# Patient Record
Sex: Female | Born: 1980 | Race: White | Hispanic: No | Marital: Single | Smoking: Current every day smoker
Health system: Southern US, Community
[De-identification: ages and names within clinical notes are randomized; demographics above are authoritative.]

---
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
## 2001-01-30 ENCOUNTER — Encounter: Payer: Self-pay | Admitting: Emergency Medicine

## 2001-01-30 ENCOUNTER — Emergency Department (HOSPITAL_COMMUNITY): Admission: EM | Admit: 2001-01-30 | Discharge: 2001-01-30 | Payer: Self-pay | Admitting: Emergency Medicine

## 2008-04-18 ENCOUNTER — Inpatient Hospital Stay (HOSPITAL_COMMUNITY): Admission: AD | Admit: 2008-04-18 | Discharge: 2008-04-18 | Payer: Self-pay | Admitting: Obstetrics & Gynecology

## 2008-10-03 ENCOUNTER — Ambulatory Visit: Payer: Self-pay | Admitting: Obstetrics & Gynecology

## 2008-10-04 ENCOUNTER — Inpatient Hospital Stay: Payer: Self-pay | Admitting: Obstetrics & Gynecology

## 2010-11-07 ENCOUNTER — Emergency Department: Payer: Self-pay | Admitting: Emergency Medicine

## 2011-02-06 LAB — URINALYSIS, ROUTINE W REFLEX MICROSCOPIC
Bilirubin Urine: NEGATIVE
Glucose, UA: NEGATIVE mg/dL
Hgb urine dipstick: NEGATIVE
Ketones, ur: NEGATIVE mg/dL
Nitrite: NEGATIVE
Protein, ur: NEGATIVE mg/dL
Specific Gravity, Urine: 1.015 (ref 1.005–1.030)
Urobilinogen, UA: 0.2 mg/dL (ref 0.0–1.0)
pH: 8.5 — ABNORMAL HIGH (ref 5.0–8.0)

## 2011-02-06 LAB — WET PREP, GENITAL
Clue Cells Wet Prep HPF POC: NONE SEEN
Trich, Wet Prep: NONE SEEN
Yeast Wet Prep HPF POC: NONE SEEN

## 2011-02-06 LAB — GC/CHLAMYDIA PROBE AMP, GENITAL
Chlamydia, DNA Probe: NEGATIVE
GC Probe Amp, Genital: NEGATIVE

## 2011-10-14 ENCOUNTER — Emergency Department: Payer: Self-pay | Admitting: Emergency Medicine

## 2011-11-10 ENCOUNTER — Emergency Department: Payer: Self-pay | Admitting: Emergency Medicine

## 2011-11-10 LAB — CBC WITH DIFFERENTIAL/PLATELET
Basophil #: 0 10*3/uL (ref 0.0–0.1)
Basophil %: 0.7 %
Eosinophil #: 0.2 10*3/uL (ref 0.0–0.7)
Eosinophil %: 3.6 %
HCT: 37.8 % (ref 35.0–47.0)
HGB: 12.3 g/dL (ref 12.0–16.0)
Lymphocyte #: 1.8 10*3/uL (ref 1.0–3.6)
Lymphocyte %: 26.4 %
MCH: 29.4 pg (ref 26.0–34.0)
MCHC: 32.5 g/dL (ref 32.0–36.0)
MCV: 90 fL (ref 80–100)
Monocyte #: 0.5 x10 3/mm (ref 0.2–0.9)
Monocyte %: 8.1 %
Neutrophil #: 4.1 10*3/uL (ref 1.4–6.5)
Neutrophil %: 61.2 %
Platelet: 273 10*3/uL (ref 150–440)
RBC: 4.19 10*6/uL (ref 3.80–5.20)
RDW: 14.6 % — ABNORMAL HIGH (ref 11.5–14.5)
WBC: 6.7 10*3/uL (ref 3.6–11.0)

## 2011-11-10 LAB — URINALYSIS, COMPLETE
Bacteria: NONE SEEN
Bilirubin,UR: NEGATIVE
Blood: NEGATIVE
Glucose,UR: NEGATIVE mg/dL (ref 0–75)
Ketone: NEGATIVE
Nitrite: NEGATIVE
Ph: 5 (ref 4.5–8.0)
Protein: NEGATIVE
RBC,UR: 1 /HPF (ref 0–5)
Specific Gravity: 1.019 (ref 1.003–1.030)
Squamous Epithelial: 24
Transitional Epi: <1
WBC UR: 7 /HPF (ref 0–5)

## 2011-11-10 LAB — COMPREHENSIVE METABOLIC PANEL
Albumin: 3.4 g/dL (ref 3.4–5.0)
Alkaline Phosphatase: 84 U/L (ref 50–136)
Anion Gap: 5 — ABNORMAL LOW (ref 7–16)
BUN: 13 mg/dL (ref 7–18)
Bilirubin,Total: 0.3 mg/dL (ref 0.2–1.0)
Calcium, Total: 8.7 mg/dL (ref 8.5–10.1)
Chloride: 107 mmol/L (ref 98–107)
Co2: 30 mmol/L (ref 21–32)
Creatinine: 0.91 mg/dL (ref 0.60–1.30)
EGFR (African American): >60
EGFR (Non-African Amer.): >60
Glucose: 88 mg/dL (ref 65–99)
Osmolality: 283 (ref 275–301)
Potassium: 4.7 mmol/L (ref 3.5–5.1)
SGOT(AST): 26 U/L (ref 15–37)
SGPT (ALT): 14 U/L
Sodium: 142 mmol/L (ref 136–145)
Total Protein: 7.4 g/dL (ref 6.4–8.2)

## 2011-11-10 LAB — SEDIMENTATION RATE: Erythrocyte Sed Rate: 1 mm/hr (ref 0–20)

## 2011-11-10 LAB — URIC ACID: Uric Acid: 3.9 mg/dL (ref 2.6–6.0)

## 2015-06-10 ENCOUNTER — Encounter: Payer: Self-pay | Admitting: Emergency Medicine

## 2015-06-10 ENCOUNTER — Emergency Department: Payer: No Typology Code available for payment source

## 2015-06-10 ENCOUNTER — Emergency Department
Admission: EM | Admit: 2015-06-10 | Discharge: 2015-06-10 | Disposition: A | Discharge status: Home / Self Care | Payer: No Typology Code available for payment source | Attending: Emergency Medicine | Admitting: Emergency Medicine

## 2015-06-10 DIAGNOSIS — Y92410 Unspecified street and highway as the place of occurrence of the external cause: Secondary | ICD-10-CM | POA: Diagnosis not present

## 2015-06-10 DIAGNOSIS — S1081XA Abrasion of other specified part of neck, initial encounter: Secondary | ICD-10-CM | POA: Diagnosis not present

## 2015-06-10 DIAGNOSIS — F17210 Nicotine dependence, cigarettes, uncomplicated: Secondary | ICD-10-CM | POA: Diagnosis not present

## 2015-06-10 DIAGNOSIS — M62838 Other muscle spasm: Secondary | ICD-10-CM | POA: Diagnosis not present

## 2015-06-10 DIAGNOSIS — S199XXA Unspecified injury of neck, initial encounter: Secondary | ICD-10-CM | POA: Diagnosis present

## 2015-06-10 DIAGNOSIS — G8929 Other chronic pain: Secondary | ICD-10-CM | POA: Diagnosis not present

## 2015-06-10 DIAGNOSIS — S3992XA Unspecified injury of lower back, initial encounter: Secondary | ICD-10-CM | POA: Diagnosis not present

## 2015-06-10 DIAGNOSIS — S161XXA Strain of muscle, fascia and tendon at neck level, initial encounter: Secondary | ICD-10-CM

## 2015-06-10 DIAGNOSIS — S20312A Abrasion of left front wall of thorax, initial encounter: Secondary | ICD-10-CM | POA: Insufficient documentation

## 2015-06-10 DIAGNOSIS — Y9389 Activity, other specified: Secondary | ICD-10-CM | POA: Insufficient documentation

## 2015-06-10 DIAGNOSIS — Y998 Other external cause status: Secondary | ICD-10-CM | POA: Insufficient documentation

## 2015-06-10 MED ORDER — IBUPROFEN 800 MG PO TABS: 800.0000 mg | ORAL_TABLET | Freq: Three times a day (TID) | ORAL | Status: DC | PRN

## 2015-06-10 MED ORDER — CYCLOBENZAPRINE HCL 10 MG PO TABS: 10.0000 mg | ORAL_TABLET | Freq: Three times a day (TID) | ORAL | Status: DC | PRN

## 2015-06-10 MED ORDER — IBUPROFEN 800 MG PO TABS
800.0000 mg | ORAL_TABLET | Freq: Once | ORAL | Status: AC
  Administered 2015-06-10: 800 mg via ORAL
  Filled 2015-06-10: qty 1

## 2015-06-10 NOTE — Discharge Instructions (Signed)
Cervical Sprain A cervical sprain is an injury in the neck in which the strong, fibrous tissues (ligaments) that connect your neck bones stretch or tear. Cervical sprains can range from mild to severe. Severe cervical sprains can cause the neck vertebrae to be unstable. This can lead to damage of the spinal cord and can result in serious nervous system problems. The amount of time it takes for a cervical sprain to get better depends on the cause and extent of the injury. Most cervical sprains heal in 1 to 3 weeks. CAUSES  Severe cervical sprains may be caused by:   Contact sport injuries (such as from football, rugby, wrestling, hockey, auto racing, gymnastics, diving, martial arts, or boxing).   Motor vehicle collisions.   Whiplash injuries. This is an injury from a sudden forward and backward whipping movement of the head and neck.  Falls.  Mild cervical sprains may be caused by:   Being in an awkward position, such as while cradling a telephone between your ear and shoulder.   Sitting in a chair that does not offer proper support.   Working at a poorly Landscape architect station.   Looking up or down for long periods of time.  SYMPTOMS   Pain, soreness, stiffness, or a burning sensation in the front, back, or sides of the neck. This discomfort may develop immediately after the injury or slowly, 24 hours or more after the injury.   Pain or tenderness directly in the middle of the back of the neck.   Shoulder or upper back pain.   Limited ability to move the neck.   Headache.   Dizziness.   Weakness, numbness, or tingling in the hands or arms.   Muscle spasms.   Difficulty swallowing or chewing.   Tenderness and swelling of the neck.  DIAGNOSIS  Most of the time your health care provider can diagnose a cervical sprain by taking your history and doing a physical exam. Your health care provider will ask about previous neck injuries and any known neck  problems, such as arthritis in the neck. X-rays may be taken to find out if there are any other problems, such as with the bones of the neck. Other tests, such as a CT scan or MRI, may also be needed.  TREATMENT  Treatment depends on the severity of the cervical sprain. Mild sprains can be treated with rest, keeping the neck in place (immobilization), and pain medicines. Severe cervical sprains are immediately immobilized. Further treatment is done to help with pain, muscle spasms, and other symptoms and may include:  Medicines, such as pain relievers, numbing medicines, or muscle relaxants.   Physical therapy. This may involve stretching exercises, strengthening exercises, and posture training. Exercises and improved posture can help stabilize the neck, strengthen muscles, and help stop symptoms from returning.  HOME CARE INSTRUCTIONS   Put ice on the injured area.   Put ice in a plastic bag.   Place a towel between your skin and the bag.   Leave the ice on for 15-20 minutes, 3-4 times a day.   If your injury was severe, you may have been given a cervical collar to wear. A cervical collar is a two-piece collar designed to keep your neck from moving while it heals.  Do not remove the collar unless instructed by your health care provider.  If you have long hair, keep it outside of the collar.  Ask your health care provider before making any adjustments to your collar. Minor  adjustments may be required over time to improve comfort and reduce pressure on your chin or on the back of your head.  Ifyou are allowed to remove the collar for cleaning or bathing, follow your health care provider's instructions on how to do so safely.  Keep your collar clean by wiping it with mild soap and water and drying it completely. If the collar you have been given includes removable pads, remove them every 1-2 days and hand wash them with soap and water. Allow them to air dry. They should be completely  dry before you wear them in the collar.  If you are allowed to remove the collar for cleaning and bathing, wash and dry the skin of your neck. Check your skin for irritation or sores. If you see any, tell your health care provider.  Do not drive while wearing the collar.   Only take over-the-counter or prescription medicines for pain, discomfort, or fever as directed by your health care provider.   Keep all follow-up appointments as directed by your health care provider.   Keep all physical therapy appointments as directed by your health care provider.   Make any needed adjustments to your workstation to promote good posture.   Avoid positions and activities that make your symptoms worse.   Warm up and stretch before being active to help prevent problems.  SEEK MEDICAL CARE IF:   Your pain is not controlled with medicine.   You are unable to decrease your pain medicine over time as planned.   Your activity level is not improving as expected.  SEEK IMMEDIATE MEDICAL CARE IF:   You develop any bleeding.  You develop stomach upset.  You have signs of an allergic reaction to your medicine.   Your symptoms get worse.   You develop new, unexplained symptoms.   You have numbness, tingling, weakness, or paralysis in any part of your body.  MAKE SURE YOU:   Understand these instructions.  Will watch your condition.  Will get help right away if you are not doing well or get worse.   This information is not intended to replace advice given to you by your health care provider. Make sure you discuss any questions you have with your health care provider.   Document Released: 02/15/2007 Document Revised: 04/25/2013 Document Reviewed: 10/26/2012 Elsevier Interactive Patient Education 2016 [employer] Inc[employer]  Cryotherapy Cryotherapy is when you put ice on your injury. Ice helps lessen pain and puffiness (swelling) after an injury. Ice works the best when you start using it  in the first 24 to 48 hours after an injury. HOME CARE  Put a dry or damp towel between the ice pack and your skin.  You may press gently on the ice pack.  Leave the ice on for no more than 10 to 20 minutes at a time.  Check your skin after 5 minutes to make sure your skin is okay.  Rest at least 20 minutes between ice pack uses.  Stop using ice when your skin loses feeling (numbness).  Do not use ice on someone who cannot tell you when it hurts. This includes small children and people with memory problems (dementia). GET HELP RIGHT AWAY IF:  You have white spots on your skin.  Your skin turns blue or pale.  Your skin feels waxy or hard.  Your puffiness gets worse. MAKE SURE YOU:   Understand these instructions.  Will watch your condition.  Will get help right away if you are not  doing well or get worse.   This information is not intended to replace advice given to you by your health care provider. Make sure you discuss any questions you have with your health care provider.   Document Released: 10/07/2007 Document Revised: 07/13/2011 Document Reviewed: 12/11/2010 Elsevier Interactive Patient Education 2016 [employer] Inc.  Muscle Cramps and Spasms Muscle cramps and spasms occur when a muscle or muscles tighten and you have no control over this tightening (involuntary muscle contraction). They are a common problem and can develop in any muscle. The most common place is in the calf muscles of the leg. Both muscle cramps and muscle spasms are involuntary muscle contractions, but they also have differences:   Muscle cramps are sporadic and painful. They may last a few seconds to a quarter of an hour. Muscle cramps are often more forceful and last longer than muscle spasms.  Muscle spasms may or may not be painful. They may also last just a few seconds or much longer. CAUSES  It is uncommon for cramps or spasms to be due to a serious underlying problem. In many cases, the cause  of cramps or spasms is unknown. Some common causes are:   Overexertion.   Overuse from repetitive motions (doing the same thing over and over).   Remaining in a certain position for a long period of time.   Improper preparation, form, or technique while performing a sport or activity.   Dehydration.   Injury.   Side effects of some medicines.   Abnormally low levels of the salts and ions in your blood (electrolytes), especially potassium and calcium. This could happen if you are taking water pills (diuretics) or you are pregnant.  Some underlying medical problems can make it more likely to develop cramps or spasms. These include, but are not limited to:   Diabetes.   Parkinson disease.   Hormone disorders, such as thyroid problems.   Alcohol abuse.   Diseases specific to muscles, joints, and bones.   Blood vessel disease where not enough blood is getting to the muscles.  HOME CARE INSTRUCTIONS   Stay well hydrated. Drink enough water and fluids to keep your urine clear or pale yellow.  It may be helpful to massage, stretch, and relax the affected muscle.  For tight or tense muscles, use a warm towel, heating pad, or hot shower water directed to the affected area.  If you are sore or have pain after a cramp or spasm, applying ice to the affected area may relieve discomfort.  Put ice in a plastic bag.  Place a towel between your skin and the bag.  Leave the ice on for 15-20 minutes, 03-04 times a day.  Medicines used to treat a known cause of cramps or spasms may help reduce their frequency or severity. Only take over-the-counter or prescription medicines as directed by your caregiver. SEEK MEDICAL CARE IF:  Your cramps or spasms get more severe, more frequent, or do not improve over time.  MAKE SURE YOU:   Understand these instructions.  Will watch your condition.  Will get help right away if you are not doing well or get worse.   This information  is not intended to replace advice given to you by your health care provider. Make sure you discuss any questions you have with your health care provider.   Document Released: 10/10/2001 Document Revised: 08/15/2012 Document Reviewed: 04/06/2012 Elsevier Interactive Patient Education 2016 [employer].  Tourist information centre manager After a car crash (motor  vehicle collision), it is normal to have bruises and sore muscles. The first 24 hours usually feel the worst. After that, you will likely start to feel better each day. HOME CARE  Put ice on the injured area.  Put ice in a plastic bag.  Place a towel between your skin and the bag.  Leave the ice on for 15-20 minutes, 03-04 times a day.  Drink enough fluids to keep your pee (urine) clear or pale yellow.  Do not drink alcohol.  Take a warm shower or bath 1 or 2 times a day. This helps your sore muscles.  Return to activities as told by your doctor. Be careful when lifting. Lifting can make neck or back pain worse.  Only take medicine as told by your doctor. Do not use aspirin. GET HELP RIGHT AWAY IF:   Your arms or legs tingle, feel weak, or lose feeling (numbness).  You have headaches that do not get better with medicine.  You have neck pain, especially in the middle of the back of your neck.  You cannot control when you pee (urinate) or poop (bowel movement).  Pain is getting worse in any part of your body.  You are short of breath, dizzy, or pass out (faint).  You have chest pain.  You feel sick to your stomach (nauseous), throw up (vomit), or sweat.  You have belly (abdominal) pain that gets worse.  There is blood in your pee, poop, or throw up.  You have pain in your shoulder (shoulder strap areas).  Your problems are getting worse. MAKE SURE YOU:   Understand these instructions.  Will watch your condition.  Will get help right away if you are not doing well or get worse.   This information is not intended  to replace advice given to you by your health care provider. Make sure you discuss any questions you have with your health care provider.   Document Released: 10/07/2007 Document Revised: 07/13/2011 Document Reviewed: 09/17/2010 Elsevier Interactive Patient Education [employer]
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
## 2016-09-16 ENCOUNTER — Emergency Department
Admission: EM | Admit: 2016-09-16 | Discharge: 2016-09-16 | Disposition: A | Discharge status: Home / Self Care | Payer: Self-pay | Attending: Emergency Medicine | Admitting: Emergency Medicine

## 2016-09-16 ENCOUNTER — Emergency Department: Payer: Self-pay

## 2016-09-16 ENCOUNTER — Encounter: Payer: Self-pay | Admitting: Emergency Medicine

## 2016-09-16 DIAGNOSIS — S0231XA Fracture of orbital floor, right side, initial encounter for closed fracture: Secondary | ICD-10-CM | POA: Insufficient documentation

## 2016-09-16 DIAGNOSIS — Z79899 Other long term (current) drug therapy: Secondary | ICD-10-CM | POA: Insufficient documentation

## 2016-09-16 DIAGNOSIS — Y999 Unspecified external cause status: Secondary | ICD-10-CM | POA: Insufficient documentation

## 2016-09-16 DIAGNOSIS — Y929 Unspecified place or not applicable: Secondary | ICD-10-CM | POA: Insufficient documentation

## 2016-09-16 DIAGNOSIS — H5711 Ocular pain, right eye: Secondary | ICD-10-CM

## 2016-09-16 DIAGNOSIS — F17210 Nicotine dependence, cigarettes, uncomplicated: Secondary | ICD-10-CM | POA: Insufficient documentation

## 2016-09-16 DIAGNOSIS — Y9389 Activity, other specified: Secondary | ICD-10-CM | POA: Insufficient documentation

## 2016-09-16 DIAGNOSIS — S0285XA Fracture of orbit, unspecified, initial encounter for closed fracture: Secondary | ICD-10-CM

## 2016-09-16 DIAGNOSIS — H547 Unspecified visual loss: Secondary | ICD-10-CM

## 2016-09-16 MED ORDER — OXYCODONE-ACETAMINOPHEN 5-325 MG PO TABS
1.0000 | ORAL_TABLET | Freq: Once | ORAL | Status: AC
  Administered 2016-09-16: 1 via ORAL
  Filled 2016-09-16: qty 1

## 2016-09-16 MED ORDER — METRONIDAZOLE 500 MG PO TABS
500.0000 mg | ORAL_TABLET | Freq: Once | ORAL | Status: DC
Start: 2016-09-16 — End: 2016-09-16

## 2016-09-16 MED ORDER — AMOXICILLIN-POT CLAVULANATE 875-125 MG PO TABS: 1.0000 | ORAL_TABLET | Freq: Once | ORAL | Status: DC

## 2016-09-16 MED ORDER — ACETAMINOPHEN 500 MG PO TABS: 1000.0000 mg | ORAL_TABLET | Freq: Once | ORAL | Status: DC

## 2016-09-16 MED ORDER — METRONIDAZOLE 500 MG PO TABS: 500.0000 mg | ORAL_TABLET | Freq: Four times a day (QID) | ORAL | 0 refills | Status: DC

## 2016-09-16 MED ORDER — AMOXICILLIN-POT CLAVULANATE 875-125 MG PO TABS: 1.0000 | ORAL_TABLET | Freq: Two times a day (BID) | ORAL | 0 refills | Status: DC

## 2016-09-16 MED ORDER — OXYCODONE-ACETAMINOPHEN 5-325 MG PO TABS: 1.0000 | ORAL_TABLET | Freq: Four times a day (QID) | ORAL | 0 refills | Status: DC | PRN

## 2016-09-16 NOTE — ED Notes (Signed)
Pt's friend present to drive pt back home.
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
## 2016-12-14 ENCOUNTER — Emergency Department
Admission: EM | Admit: 2016-12-14 | Discharge: 2016-12-14 | Disposition: A | Discharge status: Home / Self Care | Payer: Commercial Managed Care - PPO | Attending: Emergency Medicine | Admitting: Emergency Medicine

## 2016-12-14 DIAGNOSIS — J039 Acute tonsillitis, unspecified: Secondary | ICD-10-CM | POA: Diagnosis not present

## 2016-12-14 DIAGNOSIS — R350 Frequency of micturition: Secondary | ICD-10-CM | POA: Diagnosis not present

## 2016-12-14 DIAGNOSIS — R070 Pain in throat: Secondary | ICD-10-CM | POA: Diagnosis present

## 2016-12-14 DIAGNOSIS — F17210 Nicotine dependence, cigarettes, uncomplicated: Secondary | ICD-10-CM | POA: Insufficient documentation

## 2016-12-14 DIAGNOSIS — N390 Urinary tract infection, site not specified: Secondary | ICD-10-CM

## 2016-12-14 LAB — CBC WITH DIFFERENTIAL/PLATELET
BASOS ABS: 0 10*3/uL (ref 0–0.1)
Basophils Relative: 0 %
Eosinophils Absolute: 0.1 10*3/uL (ref 0–0.7)
Eosinophils Relative: 1 %
HEMATOCRIT: 36.5 % (ref 35.0–47.0)
HEMOGLOBIN: 12 g/dL (ref 12.0–16.0)
LYMPHS ABS: 1.1 10*3/uL (ref 1.0–3.6)
LYMPHS PCT: 14 %
MCH: 29 pg (ref 26.0–34.0)
MCHC: 33 g/dL (ref 32.0–36.0)
MCV: 87.7 fL (ref 80.0–100.0)
Monocytes Absolute: 0.9 10*3/uL (ref 0.2–0.9)
Monocytes Relative: 11 %
NEUTROS ABS: 6.2 10*3/uL (ref 1.4–6.5)
Neutrophils Relative %: 74 %
PLATELETS: 246 10*3/uL (ref 150–440)
RBC: 4.16 MIL/uL (ref 3.80–5.20)
RDW: 14.6 % — ABNORMAL HIGH (ref 11.5–14.5)
WBC: 8.4 10*3/uL (ref 3.6–11.0)

## 2016-12-14 LAB — BASIC METABOLIC PANEL
ANION GAP: 8 (ref 5–15)
BUN: 8 mg/dL (ref 6–20)
CHLORIDE: 100 mmol/L — AB (ref 101–111)
CO2: 28 mmol/L (ref 22–32)
Calcium: 9 mg/dL (ref 8.9–10.3)
Creatinine, Ser: 0.71 mg/dL (ref 0.44–1.00)
GFR calc Af Amer: >60 mL/min (ref >60)
GFR calc non Af Amer: >60 mL/min (ref >60)
Glucose, Bld: 112 mg/dL — ABNORMAL HIGH (ref 65–99)
POTASSIUM: 3.7 mmol/L (ref 3.5–5.1)
Sodium: 136 mmol/L (ref 135–145)

## 2016-12-14 LAB — POCT PREGNANCY, URINE: Preg Test, Ur: NEGATIVE

## 2016-12-14 LAB — URINALYSIS, ROUTINE W REFLEX MICROSCOPIC
Bilirubin Urine: NEGATIVE
GLUCOSE, UA: NEGATIVE mg/dL
NITRITE: NEGATIVE
PH: 5.5 (ref 5.0–8.0)
PROTEIN: 30 mg/dL — AB
Specific Gravity, Urine: 1.025 (ref 1.005–1.030)

## 2016-12-14 LAB — MONONUCLEOSIS SCREEN: Mono Screen: NEGATIVE

## 2016-12-14 LAB — URINALYSIS, MICROSCOPIC (REFLEX)

## 2016-12-14 LAB — POCT RAPID STREP A: STREPTOCOCCUS, GROUP A SCREEN (DIRECT): NEGATIVE

## 2016-12-14 MED ORDER — HYDROCODONE-ACETAMINOPHEN 5-325 MG PO TABS: 1.0000 | ORAL_TABLET | Freq: Four times a day (QID) | ORAL | 0 refills | Status: DC | PRN

## 2016-12-14 MED ORDER — HYDROCODONE-ACETAMINOPHEN 5-325 MG PO TABS: 1.0000 | ORAL_TABLET | ORAL | 0 refills | Status: DC | PRN

## 2016-12-14 MED ORDER — SODIUM CHLORIDE 0.9 % IV BOLUS (SEPSIS)
1000.0000 mL | Freq: Once | INTRAVENOUS | Status: AC
  Administered 2016-12-14: 1000 mL via INTRAVENOUS

## 2016-12-14 MED ORDER — CEPHALEXIN 500 MG PO CAPS: 500.0000 mg | ORAL_CAPSULE | Freq: Three times a day (TID) | ORAL | 0 refills | Status: DC

## 2016-12-14 MED ORDER — HYDROCODONE-ACETAMINOPHEN 5-325 MG PO TABS
1.0000 | ORAL_TABLET | Freq: Once | ORAL | Status: AC
  Administered 2016-12-14: 1 via ORAL
  Filled 2016-12-14: qty 1

## 2016-12-14 NOTE — ED Triage Notes (Signed)
Pt reports sore throat and chills since Saturday, also reports burning with urination and sore.

## 2016-12-14 NOTE — ED Notes (Signed)
Pt's IV infiltrated.  IV removed and ice applied to arm.  Pt verbalized understanding on using ice pack on area.

## 2016-12-14 NOTE — ED Notes (Signed)
Pt discharged to home.  Family member driving.  Discharge instructions reviewed.  Verbalized understanding.  No questions or concerns at this time.  Teach back verified.  Pt in NAD.  No items left in ED.   

## 2016-12-14 NOTE — ED Provider Notes (Signed)
[facility]  ____________________________________________   First MD Initiated Contact with Patient 12/14/16 1037     (approximate)  I have reviewed the triage vital signs and the nursing notes.   HISTORY  Chief Complaint Sore Throat and Urinary Frequency   HPI Mary ReusingChristina D Klein is a 36 y.o. female is here with complaint of sore throat and chills 2 days. She states that her throat has hurt so much that she has decreased her fluid intake. Since that time she is experienced burning with urination. She is unaware of any fever. There is been no nausea or vomiting. She states that her sore throat continues to worsen especially with swallowing. Currently she rates her pain as 10 over 10.  No past medical history on file.  There are no active problems to display for this patient.   Past Surgical History:  Procedure Laterality Date  . CESAREAN SECTION      Prior to Admission medications   Medication Sig Start Date End Date Taking? Authorizing Provider  cephALEXin (KEFLEX) 500 MG capsule Take 1 capsule (500 mg total) by mouth 3 (three) times daily. 12/14/16   Tommi RumpsSummers, Valinda Fedie L, PA-C  HYDROcodone-acetaminophen (NORCO/VICODIN) 5-325 MG tablet Take 1 tablet by mouth every 6 (six) hours as needed for moderate pain. 12/14/16   Tommi RumpsSummers, Geselle Cardosa L, PA-C    Allergies Patient has no known allergies.  No family history on file.  Social History Social History  Substance Use Topics  . Smoking status: Current Every Day Smoker    Packs/day: 0.25    Types: Cigarettes  . Smokeless tobacco: Never Used  . Alcohol use No    Review of Systems Constitutional: No fever/Positive chills Eyes: No visual changes. ENT: Positive sore throat. Cardiovascular: Denies chest pain. Respiratory: Denies shortness of breath. Gastrointestinal: No abdominal pain.  No nausea, no vomiting.   Genitourinary:  Positive dysuria. Musculoskeletal:  Negative for back pain. Skin: Negative for rash. Neurological: Negative for headaches, focal weakness or numbness.   ____________________________________________   PHYSICAL EXAM:  VITAL SIGNS: ED Triage Vitals  Enc Vitals Group     BP 12/14/16 1007 120/80     Pulse Rate 12/14/16 1007 (!) 116     Resp 12/14/16 1007 14     Temp 12/14/16 1007 98.6 F (37 C)     Temp Source 12/14/16 1007 Oral     SpO2 12/14/16 1007 100 %     Weight 12/14/16 1007 195 lb (88.5 kg)     Height 12/14/16 1007 5\' 4"  (1.626 m)     Head Circumference --      Peak Flow --      Pain Score 12/14/16 1011 10     Pain Loc --      Pain Edu? --      Excl. in GC? --    Constitutional: Alert and oriented. Well appearing and in no acute distress. Eyes: Conjunctivae are normal. PERRL. EOMI. Head: Atraumatic. Nose: No congestion/rhinnorhea.  EACs and TMs are clear. Mouth/Throat: Mucous membranes are moist.  Oropharynx non-erythematous. Positive bilateral tonsillar exudate. Patient is able to maintain secretions. Neck: No stridor.   Hematological/Lymphatic/Immunilogical: No cervical lymphadenopathy. Cardiovascular: Normal rate, regular rhythm. Grossly normal heart sounds.  Good peripheral circulation. Respiratory: Normal respiratory effort.  No retractions. Lungs CTAB. Gastrointestinal: Soft and nontender. No distention. No CVA tenderness. Musculoskeletal: Moves upper and lower extremities without difficulty. Normal gait was noted. Neurologic:  Normal speech and language. No gross focal neurologic  deficits are appreciated. No gait instability. Skin:  Skin is warm, dry and intact. No rash noted. Psychiatric: Mood and affect are normal. Speech and behavior are normal.  ____________________________________________   LABS (all labs ordered are listed, but only abnormal results are displayed)  Labs Reviewed  URINALYSIS, ROUTINE W REFLEX MICROSCOPIC - Abnormal; Notable for the following:       Result Value    APPearance HAZY (*)    Hgb urine dipstick TRACE (*)    Ketones, ur TRACE (*)    Protein, ur 30 (*)    Leukocytes, UA SMALL (*)    All other components within normal limits  URINALYSIS, MICROSCOPIC (REFLEX) - Abnormal; Notable for the following:    Bacteria, UA RARE (*)    Squamous Epithelial / LPF 0-5 (*)    All other components within normal limits  BASIC METABOLIC PANEL - Abnormal; Notable for the following:    Chloride 100 (*)    Glucose, Bld 112 (*)    All other components within normal limits  CBC WITH DIFFERENTIAL/PLATELET - Abnormal; Notable for the following:    RDW 14.6 (*)    All other components within normal limits  MONONUCLEOSIS SCREEN  POC URINE PREG, ED  POCT PREGNANCY, URINE  POCT RAPID STREP A     PROCEDURES  Procedure(s) performed: None  Procedures  Critical Care performed: No  ____________________________________________   INITIAL IMPRESSION / ASSESSMENT AND PLAN / ED COURSE  Pertinent labs & imaging results that were available during my care of the patient were reviewed by me and considered in my medical decision making (see chart for details).  Strep and mono were negative. Patient was given IV fluids for hydration and was able to urinate without any pain prior to discharge. Patient was able to drink fluids prior to discharge. She was placed on Keflex 500 mg 3 times a day for 10 days. She was given a prescription for Norco one every 6 hours as needed for throat pain. She is to follow-up with her PCP or [facility]  clinic if any continued problems or concerns.      ____________________________________________   FINAL CLINICAL IMPRESSION(S) / ED DIAGNOSES  Final diagnoses:  Exudative tonsillitis  Acute urinary tract infection      NEW MEDICATIONS STARTED DURING THIS VISIT:  Current Discharge Medication List    START taking these medications   Details  cephALEXin (KEFLEX) 500 MG capsule Take 1 capsule (500 mg total) by mouth 3 (three)  times daily. Qty: 30 capsule, Refills: 0    HYDROcodone-acetaminophen (NORCO/VICODIN) 5-325 MG tablet Take 1 tablet by mouth every 6 (six) hours as needed for moderate pain. Qty: 15 tablet, Refills: 0         Note:  This document was prepared using Dragon voice recognition software and may include unintentional dictation errors.    Tommi Rumps, PA-C 12/14/16 1635    Dionne Bucy, MD 12/15/16 [phone]

## 2016-12-14 NOTE — ED Notes (Signed)
See triage note  States she developed sore throat couple of days ago   Then ear pain  Also possible fever/chills  States having increased pain with swallowing this am   Afebrile on arrival  Also diff some dysuria yesterday

## 2016-12-14 NOTE — Discharge Instructions (Signed)
Keflex 500 mg 3 times a day for 10 days. Norco one every 6 hours as needed for moderate pain. [facility] or doctor of your choice if any continued problems. Increase fluids.

## 2016-12-16 ENCOUNTER — Emergency Department
Admission: EM | Admit: 2016-12-16 | Discharge: 2016-12-16 | Disposition: A | Discharge status: Home / Self Care | Payer: Commercial Managed Care - PPO | Attending: Emergency Medicine | Admitting: Emergency Medicine

## 2016-12-16 ENCOUNTER — Encounter: Payer: Self-pay | Admitting: Emergency Medicine

## 2016-12-16 DIAGNOSIS — N3000 Acute cystitis without hematuria: Secondary | ICD-10-CM | POA: Insufficient documentation

## 2016-12-16 DIAGNOSIS — J029 Acute pharyngitis, unspecified: Secondary | ICD-10-CM

## 2016-12-16 DIAGNOSIS — F17210 Nicotine dependence, cigarettes, uncomplicated: Secondary | ICD-10-CM | POA: Diagnosis not present

## 2016-12-16 DIAGNOSIS — Z79899 Other long term (current) drug therapy: Secondary | ICD-10-CM | POA: Diagnosis not present

## 2016-12-16 MED ORDER — PHENAZOPYRIDINE HCL 100 MG PO TABS: 100.0000 mg | ORAL_TABLET | Freq: Three times a day (TID) | ORAL | 0 refills | Status: AC | PRN

## 2016-12-16 MED ORDER — AMOXICILLIN 500 MG PO CAPS: 500.0000 mg | ORAL_CAPSULE | Freq: Two times a day (BID) | ORAL | 0 refills | Status: DC

## 2016-12-16 MED ORDER — LIDOCAINE VISCOUS 2 % MT SOLN: 10.0000 mL | OROMUCOSAL | 0 refills | Status: DC | PRN

## 2016-12-16 MED ORDER — SULFAMETHOXAZOLE-TRIMETHOPRIM 800-160 MG PO TABS: 1.0000 | ORAL_TABLET | Freq: Two times a day (BID) | ORAL | 0 refills | Status: DC

## 2016-12-16 NOTE — Discharge Instructions (Signed)
Discontinue Keflex

## 2016-12-16 NOTE — ED Provider Notes (Signed)
[facility] Emergency Department Provider Note  ____________________________________________  Time seen: Approximately 4:02 PM  I have reviewed the triage vital signs and the nursing notes.   HISTORY  Chief Complaint Sore Throat    HPI Mary Klein is a 36 y.o. female that presents to the emergency department for sore throat and fatigue for 2 days. She was seen in the emergency department 2 days ago for sore throat and UTI. She is able to eat and drink normally. Sore throat has gotten worse and UTI symptoms are the same. She is having dysuria and frequency of urination. She denies fever, SOB, CP, nausea, vomiting, abdominal pain.    History reviewed. No pertinent past medical history.  There are no active problems to display for this patient.   Past Surgical History:  Procedure Laterality Date  . CESAREAN SECTION      Prior to Admission medications   Medication Sig Start Date End Date Taking? Authorizing Provider  amoxicillin (AMOXIL) 500 MG capsule Take 1 capsule (500 mg total) by mouth 2 (two) times daily. 12/16/16   Enid DerryWagner, Dayon Witt, PA-C  cephALEXin (KEFLEX) 500 MG capsule Take 1 capsule (500 mg total) by mouth 3 (three) times daily. 12/14/16   Tommi RumpsSummers, Rhonda L, PA-C  HYDROcodone-acetaminophen (NORCO/VICODIN) 5-325 MG tablet Take 1 tablet by mouth every 6 (six) hours as needed for moderate pain. 12/14/16   Tommi RumpsSummers, Rhonda L, PA-C  lidocaine (XYLOCAINE) 2 % solution Use as directed 10 mLs in the mouth or throat as needed for mouth pain. 12/16/16   Enid DerryWagner, Kowen Kluth, PA-C  phenazopyridine (PYRIDIUM) 100 MG tablet Take 1 tablet (100 mg total) by mouth 3 (three) times daily as needed for pain. 12/16/16 12/16/17  Enid DerryWagner, Yvette Loveless, PA-C  sulfamethoxazole-trimethoprim (BACTRIM DS,SEPTRA DS) 800-160 MG tablet Take 1 tablet by mouth 2 (two) times daily. 12/16/16   Enid DerryWagner, Lashann Hagg, PA-C    Allergies Patient has no known allergies.  No family history on  file.  Social History Social History  Substance Use Topics  . Smoking status: Current Every Day Smoker    Packs/day: 0.25    Types: Cigarettes  . Smokeless tobacco: Never Used  . Alcohol use No     Review of Systems  Constitutional: No fever/chills Eyes: No visual changes. No discharge. ENT: Negative for congestion and rhinorrhea. Cardiovascular: No chest pain. Respiratory: Negative for cough. No SOB. Gastrointestinal: No abdominal pain.  No nausea, no vomiting.  No diarrhea.  No constipation. Musculoskeletal: Negative for musculoskeletal pain. Skin: Negative for rash, abrasions, lacerations, ecchymosis. Neurological: Negative for headaches.   ____________________________________________   PHYSICAL EXAM:  VITAL SIGNS: ED Triage Vitals  Enc Vitals Group     BP 12/16/16 1329 (!) 147/82     Pulse Rate 12/16/16 1329 (!) 110     Resp 12/16/16 1329 20     Temp 12/16/16 1329 98.4 F (36.9 C)     Temp Source 12/16/16 1329 Oral     SpO2 12/16/16 1329 100 %     Weight 12/16/16 1331 195 lb (88.5 kg)     Height 12/16/16 1331 5\' 4"  (1.626 m)     Head Circumference --      Peak Flow --      Pain Score 12/16/16 1329 10     Pain Loc --      Pain Edu? --      Excl. in GC? --      Constitutional: Alert and oriented. Well appearing and in no acute distress. Eyes:  Conjunctivae are normal. PERRL. EOMI. No discharge. Head: Atraumatic. ENT: No frontal and maxillary sinus tenderness.      Ears: Tympanic membranes pearly gray with good landmarks. No discharge.      Nose: No congestion/rhinnorhea.      Mouth/Throat: Mucous membranes are moist. Oropharynx erythematous. Exudates bilaterally. Uvula midline. Neck: No stridor.   Hematological/Lymphatic/Immunilogical: No cervical lymphadenopathy. Cardiovascular: Normal rate, regular rhythm.  Good peripheral circulation. Respiratory: Normal respiratory effort without tachypnea or retractions. Lungs CTAB. Good air entry to the bases with  no decreased or absent breath sounds. Gastrointestinal: Bowel sounds 4 quadrants. Soft and nontender to palpation. No guarding or rigidity. No palpable masses. No distention. Musculoskeletal: Full range of motion to all extremities. No gross deformities appreciated. Neurologic:  Normal speech and language. No gross focal neurologic deficits are appreciated.  Skin:  Skin is warm, dry and intact. No rash noted.   ____________________________________________   LABS (all labs ordered are listed, but only abnormal results are displayed)  Labs Reviewed - No data to display ____________________________________________  EKG   ____________________________________________  RADIOLOGY   No results found.  ____________________________________________    PROCEDURES  Procedure(s) performed:    Procedures    Medications - No data to display   ____________________________________________   INITIAL IMPRESSION / ASSESSMENT AND PLAN / ED COURSE  Pertinent labs & imaging results that were available during my care of the patient were reviewed by me and considered in my medical decision making (see chart for details).  Review of the [place] CSRS was performed in accordance of the [facility] prior to dispensing any controlled drugs.   Patient's diagnosis is consistent with pharyngitis and urinary tract infection. Vital signs and exam are reassuring. Strep and Monospot done 2 days ago were negative. Symptoms are not improving on Keflex. Patient will discontinue Keflex and begin amoxicillin and Bactrim. Patient will be discharged home with prescriptions for amoxicillin, Bactrim, viscous lidocaine, Pyridium. Patient is to follow up with PCP as needed or otherwise directed. Patient is given ED precautions to return to the ED for any worsening or new symptoms.  ____________________________________________  FINAL CLINICAL IMPRESSION(S) / ED DIAGNOSES  Final diagnoses:  Acute cystitis without  hematuria  Pharyngitis, unspecified etiology      NEW MEDICATIONS STARTED DURING THIS VISIT:  Discharge Medication List as of 12/16/2016  4:27 PM    START taking these medications   Details  amoxicillin (AMOXIL) 500 MG capsule Take 1 capsule (500 mg total) by mouth 2 (two) times daily., Starting Wed 12/16/2016, Print    lidocaine (XYLOCAINE) 2 % solution Use as directed 10 mLs in the mouth or throat as needed for mouth pain., Starting Wed 12/16/2016, Print    phenazopyridine (PYRIDIUM) 100 MG tablet Take 1 tablet (100 mg total) by mouth 3 (three) times daily as needed for pain., Starting Wed 12/16/2016, Until Thu 12/16/2017, Print    sulfamethoxazole-trimethoprim (BACTRIM DS,SEPTRA DS) 800-160 MG tablet Take 1 tablet by mouth 2 (two) times daily., Starting Wed 12/16/2016, Print            This chart was dictated using voice recognition software/Dragon. Despite best efforts to proofread, errors can occur which can change the meaning. Any change was purely unintentional.    Enid Derry, PA-C 12/16/16 1647    Emily Filbert, MD 12/17/16 1501

## 2016-12-16 NOTE — ED Triage Notes (Signed)
Sore throat x 4 days. Taking Keflex without improvement.

## 2017-12-14 ENCOUNTER — Other Ambulatory Visit: Payer: Self-pay

## 2017-12-14 ENCOUNTER — Emergency Department
Admission: EM | Admit: 2017-12-14 | Discharge: 2017-12-14 | Disposition: A | Discharge status: Home / Self Care | Payer: Commercial Managed Care - PPO | Attending: Emergency Medicine | Admitting: Emergency Medicine

## 2017-12-14 ENCOUNTER — Encounter: Payer: Self-pay | Admitting: Emergency Medicine

## 2017-12-14 DIAGNOSIS — M5441 Lumbago with sciatica, right side: Secondary | ICD-10-CM | POA: Insufficient documentation

## 2017-12-14 DIAGNOSIS — L02212 Cutaneous abscess of back [any part, except buttock]: Secondary | ICD-10-CM | POA: Insufficient documentation

## 2017-12-14 DIAGNOSIS — M5442 Lumbago with sciatica, left side: Secondary | ICD-10-CM | POA: Insufficient documentation

## 2017-12-14 DIAGNOSIS — L0291 Cutaneous abscess, unspecified: Secondary | ICD-10-CM

## 2017-12-14 DIAGNOSIS — Z79899 Other long term (current) drug therapy: Secondary | ICD-10-CM | POA: Insufficient documentation

## 2017-12-14 DIAGNOSIS — F17210 Nicotine dependence, cigarettes, uncomplicated: Secondary | ICD-10-CM | POA: Insufficient documentation

## 2017-12-14 LAB — URINALYSIS, COMPLETE (UACMP) WITH MICROSCOPIC
Bilirubin Urine: NEGATIVE
Glucose, UA: NEGATIVE mg/dL
Hgb urine dipstick: NEGATIVE
Ketones, ur: 5 mg/dL — AB
Nitrite: NEGATIVE
Protein, ur: NEGATIVE mg/dL
Specific Gravity, Urine: 1.032 — ABNORMAL HIGH (ref 1.005–1.030)
pH: 5 (ref 5.0–8.0)

## 2017-12-14 MED ORDER — LIDOCAINE HCL (PF) 1 % IJ SOLN
INTRAMUSCULAR | Status: AC
  Administered 2017-12-14: 5 mL
  Filled 2017-12-14: qty 5

## 2017-12-14 MED ORDER — SULFAMETHOXAZOLE-TRIMETHOPRIM 800-160 MG PO TABS: 1.0000 | ORAL_TABLET | Freq: Two times a day (BID) | ORAL | 0 refills | Status: AC

## 2017-12-14 MED ORDER — LIDOCAINE HCL 1 % IJ SOLN
5.0000 mL | Freq: Once | INTRAMUSCULAR | Status: AC
  Administered 2017-12-14: 5 mL
  Filled 2017-12-14: qty 5

## 2017-12-14 MED ORDER — PREDNISONE 20 MG PO TABS
60.0000 mg | ORAL_TABLET | Freq: Once | ORAL | Status: AC
  Administered 2017-12-14: 60 mg via ORAL
  Filled 2017-12-14: qty 3

## 2017-12-14 MED ORDER — PREDNISONE 50 MG PO TABS: ORAL_TABLET | ORAL | 0 refills | Status: DC

## 2017-12-14 NOTE — ED Provider Notes (Signed)
[facility] Emergency Department Provider Note  ____________________________________________  Time seen: Approximately 10:20 PM  I have reviewed the triage vital signs and the nursing notes.   HISTORY  Chief Complaint Insect Bite and Back Pain    HPI Mary Klein is a 37 y.o. female presents to the emergency department with multiple medical complaints.  Patient primarily presents to the emergency department with 10 out of 10 low back pain with bilateral lower extremity radiculopathy for the past day.  Patient reports a history of sciatica which is usually on the right side.  She denies dysuria, hematuria or increased urinary frequency.  She denies a history of nephrolithiasis.  She denies fever or chills.  Patient reports that she has been taking ibuprofen which temporarily relieves her symptoms.  Patient secondarily reports that she has an abscess of the upper back that is been apparent for the past 3 to 4 days it seems to be worsening.   History reviewed. No pertinent past medical history.  There are no active problems to display for this patient.   Past Surgical History:  Procedure Laterality Date  . CESAREAN SECTION      Prior to Admission medications   Medication Sig Start Date End Date Taking? Authorizing Provider  amoxicillin (AMOXIL) 500 MG capsule Take 1 capsule (500 mg total) by mouth 2 (two) times daily. 12/16/16   Enid Derry, PA-C  cephALEXin (KEFLEX) 500 MG capsule Take 1 capsule (500 mg total) by mouth 3 (three) times daily. 12/14/16   Tommi Rumps, PA-C  HYDROcodone-acetaminophen (NORCO/VICODIN) 5-325 MG tablet Take 1 tablet by mouth every 6 (six) hours as needed for moderate pain. 12/14/16   Tommi Rumps, PA-C  lidocaine (XYLOCAINE) 2 % solution Use as directed 10 mLs in the mouth or throat as needed for mouth pain. 12/16/16   Enid Derry, PA-C  phenazopyridine (PYRIDIUM) 100 MG tablet Take 1 tablet (100 mg total) by mouth  3 (three) times daily as needed for pain. 12/16/16 12/16/17  Enid Derry, PA-C  predniSONE (DELTASONE) 50 MG tablet Take one 50 mg tablet once daily for the next five days. 12/14/17   Orvil Feil, PA-C  sulfamethoxazole-trimethoprim (BACTRIM DS,SEPTRA DS) 800-160 MG tablet Take 1 tablet by mouth 2 (two) times daily for 7 days. 12/14/17 12/21/17  Orvil Feil, PA-C    Allergies Patient has no known allergies.  No family history on file.  Social History Social History   Tobacco Use  . Smoking status: Current Every Day Smoker    Packs/day: 0.25    Types: Cigarettes  . Smokeless tobacco: Never Used  Substance Use Topics  . Alcohol use: No  . Drug use: Not on file     Review of Systems  Constitutional: No fever/chills Eyes: No visual changes. No discharge ENT: No upper respiratory complaints. Cardiovascular: no chest pain. Respiratory: no cough. No SOB. Gastrointestinal: No abdominal pain.  No nausea, no vomiting.  No diarrhea.  No constipation. Genitourinary: Negative for dysuria. No hematuria Musculoskeletal: Patient has low back pain.  Skin: Patient has abscess  Neurological: Negative for headaches, focal weakness or numbness.   ____________________________________________   PHYSICAL EXAM:  VITAL SIGNS: ED Triage Vitals  Enc Vitals Group     BP 12/14/17 2010 114/76     Pulse Rate 12/14/17 2010 (!) 110     Resp 12/14/17 2010 20     Temp 12/14/17 2010 98 F (36.7 C)     Temp Source 12/14/17 2010 Oral  SpO2 12/14/17 2010 98 %     Weight 12/14/17 2008 185 lb (83.9 kg)     Height 12/14/17 2008 5\' 4"  (1.626 m)     Head Circumference --      Peak Flow --      Pain Score 12/14/17 2008 10     Pain Loc --      Pain Edu? --      Excl. in GC? --      Constitutional: Alert and oriented. Well appearing and in no acute distress. Eyes: Conjunctivae are normal. PERRL. EOMI. Head: Atraumatic. Neck: No stridor.  FROM Cardiovascular: Normal rate, regular rhythm.  Normal S1 and S2.  Good peripheral circulation. Respiratory: Normal respiratory effort without tachypnea or retractions. Lungs CTAB. Good air entry to the bases with no decreased or absent breath sounds. Gastrointestinal: Bowel sounds 4 quadrants. Soft and nontender to palpation. No guarding or rigidity. No palpable masses. No distention. No CVA tenderness. Musculoskeletal: Full range of motion to all extremities. No gross deformities appreciated.  Patient has positive straight leg raise bilaterally.  Paraspinal muscle tenderness along the lumbar spine appreciated. Neurologic:  Normal speech and language. No gross focal neurologic deficits are appreciated.  Skin: Patient has abscess at skin overlying midline proximal thoracic spine with approximately 2 cm of surrounding cellulitis.  Induration and fluctuance appreciated to palpation.   ____________________________________________   LABS (all labs ordered are listed, but only abnormal results are displayed)  Labs Reviewed  URINALYSIS, COMPLETE (UACMP) WITH MICROSCOPIC - Abnormal; Notable for the following components:      Result Value   Color, Urine YELLOW (*)    APPearance HAZY (*)    Specific Gravity, Urine 1.032 (*)    Ketones, ur 5 (*)    Leukocytes, UA TRACE (*)    Bacteria, UA RARE (*)    All other components within normal limits   ____________________________________________  EKG   ____________________________________________  RADIOLOGY   No results found.  ____________________________________________    PROCEDURES  Procedure(s) performed:    Procedures  INCISION AND DRAINAGE Performed by: Orvil FeilJaclyn M Carina Chaplin Consent: Verbal consent obtained. Risks and benefits: risks, benefits and alternatives were discussed Type: abscess  Body area: Upper back  Anesthesia: local infiltration  Incision was made with a scalpel.  Local anesthetic: lidocaine 1% without epinephrine  Anesthetic total: 2 ml  Complexity:  complex Blunt dissection to break up loculations  Drainage: purulent  Drainage amount: Copious   Packing material: 1/4 in iodoform gauze  Patient tolerance: Patient tolerated the procedure well with no immediate complications.      Medications  lidocaine (XYLOCAINE) 1 % (with pres) injection 5 mL (has no administration in time range)  predniSONE (DELTASONE) tablet 60 mg (has no administration in time range)     ____________________________________________   INITIAL IMPRESSION / ASSESSMENT AND PLAN / ED COURSE  Pertinent labs & imaging results that were available during my care of the patient were reviewed by me and considered in my medical decision making (see chart for details).  Review of the [place] CSRS was performed in accordance of the [facility] prior to dispensing any controlled drugs.      Assessment and plan Low back pain Abscess Patient presents to the emergency department with low back pain with bilateral lower extremity radiculopathy and an upper back abscess.  Patient underwent incision and drainage in the emergency department without complication.  She was treated empirically with Bactrim.  Physical exam findings were consistent with sciatica.  Patient has  failed conservative measures with anti-inflammatories and patient was given a short course of prednisone.  Patient was cautioned that bacterial infections can sometimes worsen with systemic steroid use.  Patient elected to use steroid due to the severity of her low back pain.  Return precautions were given to return to the emergency department for new or worsening symptoms.  Patient was advised to follow-up with primary care as needed.  All patient questions were answered.   ____________________________________________  FINAL CLINICAL IMPRESSION(S) / ED DIAGNOSES  Final diagnoses:  Acute bilateral low back pain with bilateral sciatica  Abscess      NEW MEDICATIONS STARTED DURING THIS VISIT:  ED Discharge  Orders         Ordered    sulfamethoxazole-trimethoprim (BACTRIM DS,SEPTRA DS) 800-160 MG tablet  2 times daily     12/14/17 2233    predniSONE (DELTASONE) 50 MG tablet     12/14/17 2233              This chart was dictated using voice recognition software/Dragon. Despite best efforts to proofread, errors can occur which can change the meaning. Any change was purely unintentional.    Orvil FeilWoods, Camay Pedigo M, PA-C 12/14/17 2242    Sharyn CreamerQuale, Mark, MD 12/14/17 [phone]

## 2017-12-14 NOTE — ED Triage Notes (Addendum)
Pt to triage via w/c with no distress noted; pt c/o lower back pain today radiating down legs today; st hx of same but denies any recent injury; also reports "spider bite"; small scabbed area noted to mid upper back with redness surrounding; pt has not taken any meds for pain relief

## 2019-02-02 ENCOUNTER — Other Ambulatory Visit: Payer: Self-pay

## 2019-02-02 ENCOUNTER — Inpatient Hospital Stay
Admission: EM | Admit: 2019-02-02 | Discharge: 2019-02-04 | DRG: 872 | Disposition: A | Discharge status: Home / Self Care | Payer: Self-pay | Attending: Internal Medicine | Admitting: Internal Medicine

## 2019-02-02 ENCOUNTER — Emergency Department: Payer: Self-pay

## 2019-02-02 DIAGNOSIS — Z20828 Contact with and (suspected) exposure to other viral communicable diseases: Secondary | ICD-10-CM | POA: Diagnosis present

## 2019-02-02 DIAGNOSIS — N136 Pyonephrosis: Secondary | ICD-10-CM | POA: Diagnosis present

## 2019-02-02 DIAGNOSIS — N1330 Unspecified hydronephrosis: Secondary | ICD-10-CM

## 2019-02-02 DIAGNOSIS — N12 Tubulo-interstitial nephritis, not specified as acute or chronic: Secondary | ICD-10-CM

## 2019-02-02 DIAGNOSIS — A4151 Sepsis due to Escherichia coli [E. coli]: Principal | ICD-10-CM | POA: Diagnosis present

## 2019-02-02 DIAGNOSIS — E876 Hypokalemia: Secondary | ICD-10-CM | POA: Diagnosis present

## 2019-02-02 DIAGNOSIS — F17210 Nicotine dependence, cigarettes, uncomplicated: Secondary | ICD-10-CM | POA: Diagnosis present

## 2019-02-02 DIAGNOSIS — N10 Acute tubulo-interstitial nephritis: Secondary | ICD-10-CM | POA: Diagnosis present

## 2019-02-02 LAB — COMPREHENSIVE METABOLIC PANEL
ALT: 17 U/L (ref 0–44)
AST: 19 U/L (ref 15–41)
Albumin: 3.5 g/dL (ref 3.5–5.0)
Alkaline Phosphatase: 87 U/L (ref 38–126)
Anion gap: 13 (ref 5–15)
BUN: 9 mg/dL (ref 6–20)
CO2: 25 mmol/L (ref 22–32)
Calcium: 8.5 mg/dL — ABNORMAL LOW (ref 8.9–10.3)
Chloride: 95 mmol/L — ABNORMAL LOW (ref 98–111)
Creatinine, Ser: 0.71 mg/dL (ref 0.44–1.00)
GFR calc Af Amer: >60 mL/min (ref >60)
GFR calc non Af Amer: >60 mL/min (ref >60)
Glucose, Bld: 98 mg/dL (ref 70–99)
Potassium: 4.2 mmol/L (ref 3.5–5.1)
Sodium: 133 mmol/L — ABNORMAL LOW (ref 135–145)
Total Bilirubin: 0.5 mg/dL (ref 0.3–1.2)
Total Protein: 7.6 g/dL (ref 6.5–8.1)

## 2019-02-02 LAB — CBC
HCT: 34.3 % — ABNORMAL LOW (ref 36.0–46.0)
Hemoglobin: 10.8 g/dL — ABNORMAL LOW (ref 12.0–15.0)
MCH: 24.8 pg — ABNORMAL LOW (ref 26.0–34.0)
MCHC: 31.5 g/dL (ref 30.0–36.0)
MCV: 78.7 fL — ABNORMAL LOW (ref 80.0–100.0)
Platelets: 314 10*3/uL (ref 150–400)
RBC: 4.36 MIL/uL (ref 3.87–5.11)
RDW: 20.3 % — ABNORMAL HIGH (ref 11.5–15.5)
WBC: 11.1 10*3/uL — ABNORMAL HIGH (ref 4.0–10.5)
nRBC: 0 % (ref 0.0–0.2)

## 2019-02-02 LAB — URINALYSIS, COMPLETE (UACMP) WITH MICROSCOPIC
Bilirubin Urine: NEGATIVE
Glucose, UA: NEGATIVE mg/dL
Ketones, ur: NEGATIVE mg/dL
Nitrite: POSITIVE — AB
Protein, ur: 30 mg/dL — AB
Specific Gravity, Urine: 1.012 (ref 1.005–1.030)
WBC, UA: >50 WBC/hpf — ABNORMAL HIGH (ref 0–5)
pH: 6 (ref 5.0–8.0)

## 2019-02-02 LAB — PREGNANCY, URINE: Preg Test, Ur: NEGATIVE

## 2019-02-02 LAB — LIPASE, BLOOD: Lipase: 19 U/L (ref 11–51)

## 2019-02-02 MED ORDER — KETOROLAC TROMETHAMINE 15 MG/ML IJ SOLN
15.0000 mg | Freq: Four times a day (QID) | INTRAMUSCULAR | Status: DC | PRN
  Administered 2019-02-03 (×2): 15 mg via INTRAVENOUS
  Filled 2019-02-02 (×2): qty 1

## 2019-02-02 MED ORDER — ONDANSETRON HCL 4 MG/2ML IJ SOLN
4.0000 mg | Freq: Once | INTRAMUSCULAR | Status: AC
  Administered 2019-02-02: 16:00:00 4 mg via INTRAVENOUS
  Filled 2019-02-02: qty 2

## 2019-02-02 MED ORDER — ONDANSETRON HCL 4 MG PO TABS: 4.0000 mg | ORAL_TABLET | Freq: Four times a day (QID) | ORAL | Status: DC | PRN

## 2019-02-02 MED ORDER — SODIUM CHLORIDE 0.9 % IV SOLN
2.0000 g | Freq: Once | INTRAVENOUS | Status: AC
  Administered 2019-02-02: 20:00:00 2 g via INTRAVENOUS
  Filled 2019-02-02: qty 20

## 2019-02-02 MED ORDER — KETOROLAC TROMETHAMINE 30 MG/ML IJ SOLN
15.0000 mg | Freq: Once | INTRAMUSCULAR | Status: AC
  Administered 2019-02-02: 19:00:00 15 mg via INTRAVENOUS
  Filled 2019-02-02: qty 1

## 2019-02-02 MED ORDER — SODIUM CHLORIDE 0.9% FLUSH: 3.0000 mL | Freq: Once | INTRAVENOUS | Status: DC

## 2019-02-02 MED ORDER — HYDROMORPHONE HCL 1 MG/ML IJ SOLN
1.0000 mg | Freq: Once | INTRAMUSCULAR | Status: AC
  Administered 2019-02-02: 16:00:00 1 mg via INTRAVENOUS
  Filled 2019-02-02: qty 1

## 2019-02-02 MED ORDER — LACTATED RINGERS IV BOLUS
1000.0000 mL | Freq: Once | INTRAVENOUS | Status: AC
  Administered 2019-02-02: 21:00:00 1000 mL via INTRAVENOUS

## 2019-02-02 MED ORDER — SODIUM CHLORIDE 0.9 % IV BOLUS
1000.0000 mL | Freq: Once | INTRAVENOUS | Status: AC
  Administered 2019-02-02: 16:00:00 1000 mL via INTRAVENOUS

## 2019-02-02 MED ORDER — HYDROCODONE-ACETAMINOPHEN 5-325 MG PO TABS: 1.0000 | ORAL_TABLET | ORAL | Status: DC | PRN

## 2019-02-02 MED ORDER — SODIUM CHLORIDE 0.9 % IV SOLN
INTRAVENOUS | Status: DC
  Administered 2019-02-02: 22:00:00 via INTRAVENOUS

## 2019-02-02 MED ORDER — ACETAMINOPHEN 650 MG RE SUPP: 650.0000 mg | Freq: Four times a day (QID) | RECTAL | Status: DC | PRN

## 2019-02-02 MED ORDER — INFLUENZA VAC SPLIT QUAD 0.5 ML IM SUSY
0.5000 mL | PREFILLED_SYRINGE | INTRAMUSCULAR | Status: DC
  Filled 2019-02-02: qty 0.5

## 2019-02-02 MED ORDER — BISACODYL 5 MG PO TBEC: 5.0000 mg | DELAYED_RELEASE_TABLET | Freq: Every day | ORAL | Status: DC | PRN

## 2019-02-02 MED ORDER — SODIUM CHLORIDE 0.9 % IV SOLN: 2.0000 g | Freq: Once | INTRAVENOUS | Status: DC

## 2019-02-02 MED ORDER — HYDROMORPHONE HCL 1 MG/ML IJ SOLN
1.0000 mg | Freq: Once | INTRAMUSCULAR | Status: AC
  Administered 2019-02-02: 19:00:00 1 mg via INTRAVENOUS
  Filled 2019-02-02: qty 1

## 2019-02-02 MED ORDER — TRAZODONE HCL 50 MG PO TABS: 25.0000 mg | ORAL_TABLET | Freq: Every evening | ORAL | Status: DC | PRN

## 2019-02-02 MED ORDER — ENOXAPARIN SODIUM 40 MG/0.4ML ~~LOC~~ SOLN
40.0000 mg | SUBCUTANEOUS | Status: DC
  Administered 2019-02-02 – 2019-02-03 (×2): 40 mg via SUBCUTANEOUS
  Filled 2019-02-02 (×2): qty 0.4

## 2019-02-02 MED ORDER — SODIUM CHLORIDE 0.9 % IV SOLN
1.0000 g | INTRAVENOUS | Status: DC
  Administered 2019-02-03 – 2019-02-04 (×2): 1 g via INTRAVENOUS
  Filled 2019-02-02 (×2): qty 1
  Filled 2019-02-02: qty 10

## 2019-02-02 MED ORDER — SODIUM CHLORIDE 0.9 % IV BOLUS
1000.0000 mL | Freq: Once | INTRAVENOUS | Status: AC
  Administered 2019-02-02: 20:00:00 1000 mL via INTRAVENOUS

## 2019-02-02 MED ORDER — ONDANSETRON HCL 4 MG/2ML IJ SOLN: 4.0000 mg | Freq: Four times a day (QID) | INTRAMUSCULAR | Status: DC | PRN

## 2019-02-02 MED ORDER — ACETAMINOPHEN 325 MG PO TABS
650.0000 mg | ORAL_TABLET | Freq: Four times a day (QID) | ORAL | Status: DC | PRN
  Administered 2019-02-03: 650 mg via ORAL
  Filled 2019-02-02: qty 2

## 2019-02-02 MED ORDER — HYDROMORPHONE HCL 1 MG/ML IJ SOLN
1.0000 mg | INTRAMUSCULAR | Status: DC | PRN
  Administered 2019-02-02 – 2019-02-04 (×5): 1 mg via INTRAVENOUS
  Filled 2019-02-02 (×5): qty 1

## 2019-02-02 NOTE — ED Notes (Signed)
Patient transported to CT 

## 2019-02-02 NOTE — ED Provider Notes (Signed)
[facility] Emergency Department Provider Note  ____________________________________________   First MD Initiated Contact with Patient 02/02/19 1545     (approximate)  I have reviewed the triage vital signs and the nursing notes.   HISTORY  Chief Complaint Urinary Frequency and Abdominal Pain    HPI Mary Klein is a 38 y.o. female here with bilateral flank pain.  Patient states that her symptoms started 3 days ago.  She reports gradual onset of progressive worsening suprapubic and right flank pain.  It has gotten progressively worse and now spread to her bilateral flank and lower back area.  She describes it as an aching, gnawing, severe pain.  She is had associated chills and fevers.  No nausea or vomiting.  No diarrhea or change in bowel habits.  She did have vaginal bleeding which she attributes to her menstrual period, which also just ended.  No history of previous pain with her other periods.  Denies any vaginal discharge prior to her sensor.  No hypersexual activities.  Denies concern for PID or STDs.  No other complaints.  No history of kidney stones.  She has noticed some mild urinary frequency and urgency, as well as hematuria today.        History reviewed. No pertinent past medical history.  Patient Active Problem List   Diagnosis Date Noted   Acute pyelonephritis 02/02/2019    Past Surgical History:  Procedure Laterality Date   CESAREAN SECTION      Prior to Admission medications   Medication Sig Start Date End Date Taking? Authorizing Provider  amoxicillin (AMOXIL) 500 MG capsule Take 1 capsule (500 mg total) by mouth 2 (two) times daily. Patient not taking: Reported on 02/02/2019 12/16/16   Enid DerryWagner, Ashley, PA-C  cephALEXin (KEFLEX) 500 MG capsule Take 1 capsule (500 mg total) by mouth 3 (three) times daily. Patient not taking: Reported on 02/02/2019 12/14/16   Tommi RumpsSummers, Rhonda L, PA-C  HYDROcodone-acetaminophen (NORCO/VICODIN)  5-325 MG tablet Take 1 tablet by mouth every 6 (six) hours as needed for moderate pain. Patient not taking: Reported on 02/02/2019 12/14/16   Bridget HartshornSummers, Rhonda L, PA-C  lidocaine (XYLOCAINE) 2 % solution Use as directed 10 mLs in the mouth or throat as needed for mouth pain. Patient not taking: Reported on 02/02/2019 12/16/16   Enid DerryWagner, Ashley, PA-C  predniSONE (DELTASONE) 50 MG tablet Take one 50 mg tablet once daily for the next five days. Patient not taking: Reported on 02/02/2019 12/14/17   Orvil FeilWoods, Jaclyn M, PA-C    Allergies Patient has no known allergies.  History reviewed. No pertinent family history.  Social History Social History   Tobacco Use   Smoking status: Current Every Day Smoker    Packs/day: 0.25    Types: Cigarettes   Smokeless tobacco: Never Used  Substance Use Topics   Alcohol use: No   Drug use: Not on file    Review of Systems  Review of Systems  Constitutional: Positive for chills and fatigue. Negative for fever.  HENT: Negative for congestion and sore throat.   Eyes: Negative for visual disturbance.  Respiratory: Negative for cough and shortness of breath.   Cardiovascular: Negative for chest pain.  Gastrointestinal: Negative for abdominal pain, diarrhea, nausea and vomiting.  Genitourinary: Positive for dysuria and frequency. Negative for flank pain.  Musculoskeletal: Negative for back pain and neck pain.  Skin: Negative for rash and wound.  Neurological: Negative for weakness.     ____________________________________________  PHYSICAL EXAM:  VITAL SIGNS: ED Triage Vitals [02/02/19 1459]  Enc Vitals Group     BP 139/89     Pulse Rate (!) 122     Resp 18     Temp 99.6 F (37.6 C)     Temp Source Oral     SpO2 98 %     Weight 220 lb (99.8 kg)     Height 5\' 4"  (1.626 m)     Head Circumference      Peak Flow      Pain Score 10     Pain Loc      Pain Edu?      Excl. in GC?      Physical Exam Vitals signs and nursing note reviewed.    Constitutional:      General: She is not in acute distress.    Appearance: She is well-developed.  HENT:     Head: Normocephalic and atraumatic.  Eyes:     Conjunctiva/sclera: Conjunctivae normal.  Neck:     Musculoskeletal: Neck supple.  Cardiovascular:     Rate and Rhythm: Normal rate and regular rhythm.     Heart sounds: Normal heart sounds. No murmur. No friction rub.  Pulmonary:     Effort: Pulmonary effort is normal. No respiratory distress.     Breath sounds: Normal breath sounds. No wheezing or rales.  Abdominal:     General: There is no distension.     Palpations: Abdomen is soft.     Tenderness: There is no abdominal tenderness.  Skin:    General: Skin is warm.     Capillary Refill: Capillary refill takes less than 2 seconds.  Neurological:     Mental Status: She is alert and oriented to person, place, and time.     Motor: No abnormal muscle tone.       ____________________________________________   LABS (all labs ordered are listed, but only abnormal results are displayed)  Labs Reviewed  COMPREHENSIVE METABOLIC PANEL - Abnormal; Notable for the following components:      Result Value   Sodium 133 (*)    Chloride 95 (*)    Calcium 8.5 (*)    All other components within normal limits  CBC - Abnormal; Notable for the following components:   WBC 11.1 (*)    Hemoglobin 10.8 (*)    HCT 34.3 (*)    MCV 78.7 (*)    MCH 24.8 (*)    RDW 20.3 (*)    All other components within normal limits  URINALYSIS, COMPLETE (UACMP) WITH MICROSCOPIC - Abnormal; Notable for the following components:   Color, Urine YELLOW (*)    APPearance CLOUDY (*)    Hgb urine dipstick SMALL (*)    Protein, ur 30 (*)    Nitrite POSITIVE (*)    Leukocytes,Ua MODERATE (*)    WBC, UA >50 (*)    Bacteria, UA MANY (*)    All other components within normal limits  URINE CULTURE  CULTURE, BLOOD (ROUTINE X 2)  CULTURE, BLOOD (ROUTINE X 2)  SARS CORONAVIRUS 2 (TAT 6-24 HRS)  LIPASE, BLOOD   PREGNANCY, URINE  HIV ANTIBODY (ROUTINE TESTING W REFLEX)  HIV4GL SAVE TUBE  BASIC METABOLIC PANEL  CBC  POC URINE PREG, ED    ____________________________________________  EKG: None ________________________________________  RADIOLOGY All imaging, including plain films, CT scans, and ultrasounds, independently reviewed by me, and interpretations confirmed via formal radiology reads.  ED MD interpretation:   CT: No stone, likely pyelo  Official radiology report(s): Ct Renal Stone Study  Result Date: 02/02/2019 CLINICAL DATA:  Flank pain and dysuria EXAM: CT ABDOMEN AND PELVIS WITHOUT CONTRAST TECHNIQUE: Multidetector CT imaging of the abdomen and pelvis was performed following the standard protocol without oral or IV contrast. COMPARISON:  None. FINDINGS: Lower chest: Lung bases are clear. Hepatobiliary: Liver measures 21.4 cm in length. No focal liver lesions are evident on this noncontrast enhanced study. Gallbladder wall is not appreciably thickened. There is no biliary duct dilatation. Pancreas: No pancreatic mass or inflammatory focus. Spleen: No splenic lesions are evident. Adrenals/Urinary Tract: Adrenals bilaterally appear unremarkable. The right kidney is edematous with perinephric stranding. There is no evident renal mass on either side. There is mild hydronephrosis on the right. There is no appreciable hydronephrosis on the left. There is a 1 mm calculus in the mid right kidney. There is no intrarenal calculus on the left. There is no appreciable ureteral calculus on either side. Urinary bladder is midline with wall thickness within normal limits. Note that there are phleboliths near but felt to be separate from each distal ureter. Stomach/Bowel: There is no appreciable bowel wall or mesenteric thickening. There is no evident bowel obstruction. There is a 1.8 x 1.6 cm lipoma at the terminal ileum-cecum junction. This lipoma appears to be slightly proximal to the expected level of  the ileocecal valve. No free air or portal venous air evident. Vascular/Lymphatic: There is no abdominal aortic aneurysm. No vascular lesions are evident on this noncontrast enhanced study. There is no appreciable adenopathy in the abdomen or pelvis. Reproductive: Uterus is anteverted. There are follicles in each ovary. Beyond these follicles, there is no extrauterine pelvic mass. Tampon present. Other: Appendix appears unremarkable. There is no abscess or ascites in abdomen or pelvis. Musculoskeletal: There is multilevel degenerative change in the lumbar spine. There is a nitrogen containing cystic area in the L3 vertebral body measuring 1.3 x 1.0 cm. No blastic or lytic bone lesions. No intramuscular or abdominal wall lesions are evident. IMPRESSION: 1. The right kidney is edematous with perinephric stranding. There is mild hydronephrosis and ureteral prominence on the right. No obstructing focus is evident. Question recent calculus passage on the right. Pyelonephritis is a differential consideration given this appearance. Note that urinary bladder wall thickness is within normal limits. 2. 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 No bowel obstruction. No abscess in the abdomen or pelvis. Appendix appears normal. 3.  Prominent liver without focal liver lesion evident. 4.  Extensive arthropathy throughout the lumbar spine. Electronically Signed   By: Bretta Bang III M.D.   On: 02/02/2019 16:56    ____________________________________________  PROCEDURES   Procedure(s) performed (including Critical Care):  .Critical Care Performed by: Shaune Pollack, MD Authorized by: Shaune Pollack, MD   Critical care provider statement:    Critical care time (minutes):  35   Critical care time was exclusive of:  Separately billable procedures and treating other patients and teaching time   Critical care was necessary to treat or prevent imminent or  life-threatening deterioration of the following conditions:  Circulatory failure, cardiac failure and sepsis   Critical care was time spent personally by me on the following activities:  Development of treatment plan with patient or surrogate, discussions with consultants, evaluation of patient's response to treatment, examination of patient, obtaining history from patient or surrogate, ordering and performing treatments and interventions, ordering and review of laboratory studies, ordering and review of  radiographic studies, pulse oximetry, re-evaluation of patient's condition and review of old charts   I assumed direction of critical care for this patient from another provider in my specialty: no      ____________________________________________  INITIAL IMPRESSION / MDM / [place] / ED COURSE  As part of my medical decision making, I reviewed the following data within the electronic MEDICAL RECORD NUMBER Notes from prior ED visits and [place] Controlled Substance Database      *ALIDA GREINER was evaluated in Emergency Department on 02/02/2019 for the symptoms described in the history of present illness. She was evaluated in the context of the global COVID-19 pandemic, which necessitated consideration that the patient might be at risk for infection with the SARS-CoV-2 virus that causes COVID-19. Institutional protocols and algorithms that pertain to the evaluation of patients at risk for COVID-19 are in a state of rapid change based on information released by regulatory bodies including the CDC and federal and state organizations. These policies and algorithms were followed during the patient's care in the ED.  Some ED evaluations and interventions may be delayed as a result of limited staffing during the pandemic.*   Clinical Course as of Feb 02 2339  Thu Feb 02, 2019  1757 Labs are consistent with early sepsis from UTI with pyelonephritis.  CT imaging confirms this.  Patient has  persistent severe nausea and vomiting despite multiple doses of analgesics and antiemetics.  Will plan to admit.   [CI]    Clinical Course User Index [CI] Duffy Bruce, MD    Medical Decision Making:  As above. Early sepsis 2/2 pyelo with persistent n/v. Admit.   ____________________________________________  FINAL CLINICAL IMPRESSION(S) / ED DIAGNOSES  Final diagnoses:  Pyelonephritis     MEDICATIONS GIVEN DURING THIS VISIT:  Medications  0.9 %  sodium chloride infusion ( Intravenous New Bag/Given 02/02/19 2217)  acetaminophen (TYLENOL) tablet 650 mg (has no administration in time range)    Or  acetaminophen (TYLENOL) suppository 650 mg (has no administration in time range)  HYDROcodone-acetaminophen (NORCO/VICODIN) 5-325 MG per tablet 1-2 tablet (has no administration in time range)  traZODone (DESYREL) tablet 25 mg (has no administration in time range)  bisacodyl (DULCOLAX) EC tablet 5 mg (has no administration in time range)  ondansetron (ZOFRAN) tablet 4 mg (has no administration in time range)    Or  ondansetron (ZOFRAN) injection 4 mg (has no administration in time range)  enoxaparin (LOVENOX) injection 40 mg (40 mg Subcutaneous Given 02/02/19 2158)  cefTRIAXone (ROCEPHIN) 1 g in sodium chloride 0.9 % 100 mL IVPB (has no administration in time range)  ketorolac (TORADOL) 15 MG/ML injection 15 mg (has no administration in time range)  HYDROmorphone (DILAUDID) injection 1 mg (1 mg Intravenous Given 02/02/19 2156)  influenza vac split quadrivalent PF (FLUARIX) injection 0.5 mL (has no administration in time range)  sodium chloride 0.9 % bolus 1,000 mL (0 mLs Intravenous Stopped 02/02/19 1925)  HYDROmorphone (DILAUDID) injection 1 mg (1 mg Intravenous Given 02/02/19 1602)  ondansetron (ZOFRAN) injection 4 mg (4 mg Intravenous Given 02/02/19 1559)  ketorolac (TORADOL) 30 MG/ML injection 15 mg (15 mg Intravenous Given 02/02/19 1845)  sodium chloride 0.9 % bolus 1,000 mL (0 mLs  Intravenous Stopped 02/02/19 2051)  cefTRIAXone (ROCEPHIN) 2 g in sodium chloride 0.9 % 100 mL IVPB (0 g Intravenous Stopped 02/02/19 2039)  HYDROmorphone (DILAUDID) injection 1 mg (1 mg Intravenous Given 02/02/19 1914)  lactated ringers bolus 1,000 mL (1,000 mLs Intravenous Transfusing/Transfer  02/02/19 2051)     ED Discharge Orders    None       Note:  This document was prepared using Dragon voice recognition software and may include unintentional dictation errors.   Shaune Pollack, MD 02/02/19 2340

## 2019-02-02 NOTE — ED Notes (Signed)
One failed attempt to collect a set of blood cultures (left AC).

## 2019-02-02 NOTE — ED Notes (Signed)
ED TO INPATIENT HANDOFF REPORT  ED Nurse Name and Phone #: Gwynneth MunsonButch, RN [phone]  S Name/Age/Gender Mary Klein 38 y.o. female Room/Bed: ED05A/ED05A  Code Status   Code Status: Not on file  Home/SNF/Other Home Patient oriented to: self, place, time and situation Is this baseline? Yes   Triage Complete: Triage complete  Chief Complaint abd pain  Triage Note Pt c/o lower abd pain that radiates into BL flank for the past 2-3 days with painful urination. Denies N/V/D.Marland Kitchen.   Allergies No Known Allergies  Level of Care/Admitting Diagnosis ED Disposition    ED Disposition Condition Comment   Admit  Hospital Area: [facility] [[id number]]  Level of Care: Med-Surg [16]  Covid Evaluation: Asymptomatic Screening Protocol (No Symptoms)  Diagnosis: Acute pyelonephritis [454098][[id number]]  Admitting Physician: Katha HammingKONIDENA, SNEHALATHA [[id number]]  Attending Physician: Katha HammingKONIDENA, SNEHALATHA [phone][[id number]]  Estimated length of stay: past midnight tomorrow  Certification:: I certify this patient will need inpatient services for at least 2 midnights  PT Class (Do Not Modify): Inpatient [101]  PT Acc Code (Do Not Modify): Private [1]       B Medical/Surgery History History reviewed. No pertinent past medical history. Past Surgical History:  Procedure Laterality Date  . CESAREAN SECTION       A IV Location/Drains/Wounds Patient Lines/Drains/Airways Status   Active Line/Drains/Airways    Name:   Placement date:   Placement time:   Site:   Days:   Peripheral IV 02/02/19 Left Antecubital   02/02/19    1533    Antecubital   less than 1          Intake/Output Last 24 hours  Intake/Output Summary (Last 24 hours) at 02/02/2019 2032 Last data filed at 02/02/2019 1925 Gross per 24 hour  Intake 1000 ml  Output -  Net 1000 ml    Labs/Imaging Results for orders placed or performed during the hospital encounter of 02/02/19 (from the past 48 hour(s))  Lipase, blood     Status:  None   Collection Time: 02/02/19  3:34 PM  Result Value Ref Range   Lipase 19 11 - 51 U/L    Comment: Performed at [facility], [address] [place], [place] [address]  Comprehensive metabolic panel     Status: Abnormal   Collection Time: 02/02/19  3:34 PM  Result Value Ref Range   Sodium 133 (L) 135 - 145 mmol/L   Potassium 4.2 3.5 - 5.1 mmol/L   Chloride 95 (L) 98 - 111 mmol/L   CO2 25 22 - 32 mmol/L   Glucose, Bld 98 70 - 99 mg/dL   BUN 9 6 - 20 mg/dL   Creatinine, Ser 5.780.71 0.44 - 1.00 mg/dL   Calcium 8.5 (L) 8.9 - 10.3 mg/dL   Total Protein 7.6 6.5 - 8.1 g/dL   Albumin 3.5 3.5 - 5.0 g/dL   AST 19 15 - 41 U/L   ALT 17 0 - 44 U/L   Alkaline Phosphatase 87 38 - 126 U/L   Total Bilirubin 0.5 0.3 - 1.2 mg/dL   GFR calc non Af Amer >60 >60 mL/min   GFR calc Af Amer >60 >60 mL/min   Anion gap 13 5 - 15    Comment: Performed at [facility], [address] [place], [place] [address]  CBC     Status: Abnormal   Collection Time: 02/02/19  3:34 PM  Result Value Ref Range   WBC 11.1 (H) 4.0 - 10.5 K/uL  RBC 4.36 3.87 - 5.11 MIL/uL   Hemoglobin 10.8 (L) 12.0 - 15.0 g/dL   HCT 04.5 (L) 40.9 - 81.1 %   MCV 78.7 (L) 80.0 - 100.0 fL   MCH 24.8 (L) 26.0 - 34.0 pg   MCHC 31.5 30.0 - 36.0 g/dL   RDW 91.4 (H) 78.2 - 95.6 %   Platelets 314 150 - 400 K/uL   nRBC 0.0 0.0 - 0.2 %    Comment: Performed at [facility], [address] [place], [place] [address]  Urinalysis, Complete w Microscopic     Status: Abnormal   Collection Time: 02/02/19  3:34 PM  Result Value Ref Range   Color, Urine YELLOW (A) YELLOW   APPearance CLOUDY (A) CLEAR   Specific Gravity, Urine 1.012 1.005 - 1.030   pH 6.0 5.0 - 8.0   Glucose, UA NEGATIVE NEGATIVE mg/dL   Hgb urine dipstick SMALL (A) NEGATIVE   Bilirubin Urine NEGATIVE NEGATIVE   Ketones, ur NEGATIVE NEGATIVE mg/dL   Protein, ur 30 (A) NEGATIVE mg/dL   Nitrite POSITIVE (A) NEGATIVE   Leukocytes,Ua MODERATE (A)  NEGATIVE   RBC / HPF 11-20 0 - 5 RBC/hpf   WBC, UA >50 (H) 0 - 5 WBC/hpf   Bacteria, UA MANY (A) NONE SEEN   Squamous Epithelial / LPF 0-5 0 - 5   WBC Clumps PRESENT    Mucus PRESENT     Comment: Performed at [facility], [address] [place], [place] [address]  Pregnancy, urine     Status: None   Collection Time: 02/02/19  3:34 PM  Result Value Ref Range   Preg Test, Ur NEGATIVE NEGATIVE    Comment: Performed at [facility], [address] [place], [place] [address]   Ct Renal Stone Study  Result Date: 02/02/2019 CLINICAL DATA:  Flank pain and dysuria EXAM: CT ABDOMEN AND PELVIS WITHOUT CONTRAST TECHNIQUE: Multidetector CT imaging of the abdomen and pelvis was performed following the standard protocol without oral or IV contrast. COMPARISON:  None. FINDINGS: Lower chest: Lung bases are clear. Hepatobiliary: Liver measures 21.4 cm in length. No focal liver lesions are evident on this noncontrast enhanced study. Gallbladder wall is not appreciably thickened. There is no biliary duct dilatation. Pancreas: No pancreatic mass or inflammatory focus. Spleen: No splenic lesions are evident. Adrenals/Urinary Tract: Adrenals bilaterally appear unremarkable. The right kidney is edematous with perinephric stranding. There is no evident renal mass on either side. There is mild hydronephrosis on the right. There is no appreciable hydronephrosis on the left. There is a 1 mm calculus in the mid right kidney. There is no intrarenal calculus on the left. There is no appreciable ureteral calculus on either side. Urinary bladder is midline with wall thickness within normal limits. Note that there are phleboliths near but felt to be separate from each distal ureter. Stomach/Bowel: There is no appreciable bowel wall or mesenteric thickening. There is no evident bowel obstruction. There is a 1.8 x 1.6 cm lipoma at the terminal ileum-cecum junction. This lipoma appears to be slightly proximal to  the expected level of the ileocecal valve. No free air or portal venous air evident. Vascular/Lymphatic: There is no abdominal aortic aneurysm. No vascular lesions are evident on this noncontrast enhanced study. There is no appreciable adenopathy in the abdomen or pelvis. Reproductive: Uterus is anteverted. There are follicles in each ovary. Beyond these follicles, there is no extrauterine pelvic mass. Tampon present. Other: Appendix appears unremarkable. There is no abscess  or ascites in abdomen or pelvis. Musculoskeletal: There is multilevel degenerative change in the lumbar spine. There is a nitrogen containing cystic area in the L3 vertebral body measuring 1.3 x 1.0 cm. No blastic or lytic bone lesions. No intramuscular or abdominal wall lesions are evident. IMPRESSION: 1. The right kidney is edematous with perinephric stranding. There is mild hydronephrosis and ureteral prominence on the right. No obstructing focus is evident. Question recent calculus passage on the right. Pyelonephritis is a differential consideration given this appearance. Note that urinary bladder wall thickness is within normal limits. 2. 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 No bowel obstruction. No abscess in the abdomen or pelvis. Appendix appears normal. 3.  Prominent liver without focal liver lesion evident. 4.  Extensive arthropathy throughout the lumbar spine. Electronically Signed   By: Bretta Bang III M.D.   On: 02/02/2019 16:56    Pending Labs Unresulted Labs (From admission, onward)    Start     Ordered   02/02/19 1816  SARS CORONAVIRUS 2 (TAT 6-24 HRS) Nasopharyngeal Nasopharyngeal Swab  (Asymptomatic/Tier 2 Patients Labs)  Once,   STAT    Question Answer Comment  Is this test for diagnosis or screening Screening   Symptomatic for COVID-19 as defined by CDC No   Hospitalized for COVID-19 No   Admitted to ICU for COVID-19 No   Previously tested for COVID-19 No    Resident in a congregate (group) care setting No   Employed in healthcare setting No   Pregnant No      02/02/19 1816   02/02/19 1752  Blood culture (routine x 2)  BLOOD CULTURE X 2,   STAT     02/02/19 1751   02/02/19 1643  Urine culture  Add-on,   AD     02/02/19 1642   Signed and Held  HIV Antibody (routine testing w rflx)  (HIV Antibody (Routine testing w reflex) panel)  Once,   R     Signed and Held   Signed and Held  HIV4GL Save Tube  (HIV Antibody (Routine testing w reflex) panel)  Once,   R     Signed and Held   Signed and Held  Basic metabolic panel  Tomorrow morning,   R     Signed and Held   Signed and Held  CBC  Tomorrow morning,   R     Signed and Held   Signed and Held  CBC  (enoxaparin (LOVENOX)    CrCl >/= 30 ml/min)  Once,   R    Comments: Baseline for enoxaparin therapy IF NOT ALREADY DRAWN.  Notify MD if PLT < 100 K.    Signed and Held   Signed and Held  Creatinine, serum  (enoxaparin (LOVENOX)    CrCl >/= 30 ml/min)  Once,   R    Comments: Baseline for enoxaparin therapy IF NOT ALREADY DRAWN.    Signed and Held   Signed and Held  Creatinine, serum  (enoxaparin (LOVENOX)    CrCl >/= 30 ml/min)  Weekly,   R    Comments: while on enoxaparin therapy    Signed and Held          Vitals/Pain Today's Vitals   02/02/19 1549 02/02/19 1905 02/02/19 1929 02/02/19 1935  BP:   (!) 99/59   Pulse:   (!) 102   Resp:   16   Temp:      TempSrc:  SpO2:   96%   Weight:      Height:      PainSc: 10-Worst pain ever 7   2     Isolation Precautions No active isolations  Medications Medications  lactated ringers bolus 1,000 mL (has no administration in time range)  sodium chloride 0.9 % bolus 1,000 mL (0 mLs Intravenous Stopped 02/02/19 1925)  HYDROmorphone (DILAUDID) injection 1 mg (1 mg Intravenous Given 02/02/19 1602)  ondansetron (ZOFRAN) injection 4 mg (4 mg Intravenous Given 02/02/19 1559)  ketorolac (TORADOL) 30 MG/ML injection 15 mg (15 mg Intravenous  Given 02/02/19 1845)  sodium chloride 0.9 % bolus 1,000 mL (1,000 mLs Intravenous New Bag/Given 02/02/19 1935)  cefTRIAXone (ROCEPHIN) 2 g in sodium chloride 0.9 % 100 mL IVPB (2 g Intravenous New Bag/Given 02/02/19 1932)  HYDROmorphone (DILAUDID) injection 1 mg (1 mg Intravenous Given 02/02/19 1914)    Mobility walks Low fall risk   Focused Assessments    R Recommendations: See Admitting Provider Note  Report given to:   Additional Notes: None

## 2019-02-02 NOTE — ED Notes (Signed)
ED Provider at bedside. 

## 2019-02-02 NOTE — H&P (Signed)
[facility] [place]   PATIENT NAME: Mary Klein    MR#:  [id number]  DATE OF BIRTH:  04-23-1981  DATE OF ADMISSION:  02/02/2019  PRIMARY CARE PHYSICIAN: Patient, No Pcp Per   REQUESTING/REFERRING PHYSICIAN:full  CHIEF COMPLAINT: Right flank pain   Chief Complaint  Patient presents with  . Urinary Frequency  . Abdominal Pain    HISTORY OF PRESENT ILLNESS:  Mary Klein  is a 38 y.o. female with no medical history comes in because of severe right flank pain going on for 3 days, fever.  Noted to have right-sided pyonephritis with UTI, mild right hydronephrosis.  Patient has severe right-sided pain, received a morphine, Toradol, Rocephin in the emergency room, admitting the patient for sepsis with right-sided pyonephritis.  PAST MEDICAL HISTORY:  History reviewed. No pertinent past medical history.  PAST SURGICAL HISTOIRY:   Past Surgical History:  Procedure Laterality Date  . CESAREAN SECTION      SOCIAL HISTORY:   Social History   Tobacco Use  . Smoking status: Current Every Day Smoker    Packs/day: 0.25    Types: Cigarettes  . Smokeless tobacco: Never Used  Substance Use Topics  . Alcohol use: No    FAMILY HISTORY:  No family history on file.  DRUG ALLERGIES:  No Known Allergies  REVIEW OF SYSTEMS:  CONSTITUTIONAL: FEVER EYES: No blurred or double vision.  EARS, NOSE, AND THROAT: No tinnitus or ear pain.  RESPIRATORY: No cough, shortness of breath, wheezing or hemoptysis.  CARDIOVASCULAR: No chest pain, orthopnea, edema.  GASTROINTESTINA 'nausea, right flank pain GENITOURINARY: No dysuria, hematuria.  ENDOCRINE: No polyuria, nocturia,  HEMATOLOGY: No anemia, easy bruising or bleeding SKIN: No rash or lesion. MUSCULOSKELETAL: No joint pain or arthritis.   NEUROLOGIC: No tingling, numbness, weakness.  PSYCHIATRY: No anxiety or depression.   MEDICATIONS AT HOME:   Prior to Admission medications    Medication Sig Start Date End Date Taking? Authorizing Provider  amoxicillin (AMOXIL) 500 MG capsule Take 1 capsule (500 mg total) by mouth 2 (two) times daily. Patient not taking: Reported on 02/02/2019 12/16/16   Enid Derry, PA-C  cephALEXin (KEFLEX) 500 MG capsule Take 1 capsule (500 mg total) by mouth 3 (three) times daily. Patient not taking: Reported on 02/02/2019 12/14/16   Tommi Rumps, PA-C  HYDROcodone-acetaminophen (NORCO/VICODIN) 5-325 MG tablet Take 1 tablet by mouth every 6 (six) hours as needed for moderate pain. Patient not taking: Reported on 02/02/2019 12/14/16   Bridget Hartshorn L, PA-C  lidocaine (XYLOCAINE) 2 % solution Use as directed 10 mLs in the mouth or throat as needed for mouth pain. Patient not taking: Reported on 02/02/2019 12/16/16   Enid Derry, PA-C  predniSONE (DELTASONE) 50 MG tablet Take one 50 mg tablet once daily for the next five days. Patient not taking: Reported on 02/02/2019 12/14/17   Orvil Feil, PA-C      VITAL SIGNS:  Blood pressure (!) 99/59, pulse (!) 102, temperature 99.6 F (37.6 C), temperature source Oral, resp. rate 16, height 5\' 4"  (1.626 m), weight 99.8 kg, last menstrual period 01/26/2019, SpO2 96 %.  PHYSICAL EXAMINATION:  GENERAL:  38 y.o.-year-old patient lying in the bed with no acute distress.  Patient appears very uncomfortable, crying because of right flank pain. EYES: Pupils equal, round, reactive to light and accommodation. No scleral icterus. Extraocular muscles intact.  HEENT: Head atraumatic, normocephalic. Oropharynx and nasopharynx clear.  NECK:  Supple, no jugular venous distention. No  thyroid enlargement, no tenderness.  LUNGS: Normal breath sounds bilaterally, no wheezing, rales,rhonchi or crepitation. No use of accessory muscles of respiration.  CARDIOVASCULAR: S1, S2 normal. No murmurs, rubs, or gallops.  ABDOMEN:  severe right flank tenderness present .no rebound tenderness, patient appears very uncomfortable.   EXTREMITIES: No pedal edema, cyanosis, or clubbing.  NEUROLOGIC: Cranial nerves II through XII are intact. Muscle strength 5/5 in all extremities. Sensation intact. Gait not checked.  PSYCHIATRIC: The patient is alert and oriented x 3.  SKIN: No obvious rash, lesion, or ulcer.   LABORATORY PANEL:   CBC Recent Labs  Lab 02/02/19 1534  WBC 11.1*  HGB 10.8*  HCT 34.3*  PLT 314   ------------------------------------------------------------------------------------------------------------------  Chemistries  Recent Labs  Lab 02/02/19 1534  NA 133*  K 4.2  CL 95*  CO2 25  GLUCOSE 98  BUN 9  CREATININE 0.71  CALCIUM 8.5*  AST 19  ALT 17  ALKPHOS 87  BILITOT 0.5   ------------------------------------------------------------------------------------------------------------------  Cardiac Enzymes No results for input(s): TROPONINI in the last 168 hours. ------------------------------------------------------------------------------------------------------------------  RADIOLOGY:  Ct Renal Stone Study  Result Date: 02/02/2019 CLINICAL DATA:  Flank pain and dysuria EXAM: CT ABDOMEN AND PELVIS WITHOUT CONTRAST TECHNIQUE: Multidetector CT imaging of the abdomen and pelvis was performed following the standard protocol without oral or IV contrast. COMPARISON:  None. FINDINGS: Lower chest: Lung bases are clear. Hepatobiliary: Liver measures 21.4 cm in length. No focal liver lesions are evident on this noncontrast enhanced study. Gallbladder wall is not appreciably thickened. There is no biliary duct dilatation. Pancreas: No pancreatic mass or inflammatory focus. Spleen: No splenic lesions are evident. Adrenals/Urinary Tract: Adrenals bilaterally appear unremarkable. The right kidney is edematous with perinephric stranding. There is no evident renal mass on either side. There is mild hydronephrosis on the right. There is no appreciable hydronephrosis on the left. There is a 1 mm calculus in  the mid right kidney. There is no intrarenal calculus on the left. There is no appreciable ureteral calculus on either side. Urinary bladder is midline with wall thickness within normal limits. Note that there are phleboliths near but felt to be separate from each distal ureter. Stomach/Bowel: There is no appreciable bowel wall or mesenteric thickening. There is no evident bowel obstruction. There is a 1.8 x 1.6 cm lipoma at the terminal ileum-cecum junction. This lipoma appears to be slightly proximal to the expected level of the ileocecal valve. No free air or portal venous air evident. Vascular/Lymphatic: There is no abdominal aortic aneurysm. No vascular lesions are evident on this noncontrast enhanced study. There is no appreciable adenopathy in the abdomen or pelvis. Reproductive: Uterus is anteverted. There are follicles in each ovary. Beyond these follicles, there is no extrauterine pelvic mass. Tampon present. Other: Appendix appears unremarkable. There is no abscess or ascites in abdomen or pelvis. Musculoskeletal: There is multilevel degenerative change in the lumbar spine. There is a nitrogen containing cystic area in the L3 vertebral body measuring 1.3 x 1.0 cm. No blastic or lytic bone lesions. No intramuscular or abdominal wall lesions are evident. IMPRESSION: 1. The right kidney is edematous with perinephric stranding. There is mild hydronephrosis and ureteral prominence on the right. No obstructing focus is evident. Question recent calculus passage on the right. Pyelonephritis is a differential consideration given this appearance. Note that urinary bladder wall thickness is within normal limits. 2. 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 No bowel obstruction. No  abscess in the abdomen or pelvis. Appendix appears normal. 3.  Prominent liver without focal liver lesion evident. 4.  Extensive arthropathy throughout the lumbar spine. Electronically Signed    By: Bretta BangWilliam  Woodruff III M.D.   On: 02/02/2019 16:56     EKG:  No orders found for this or any previous visit.  IMPRESSION AND PLAN:   Acute right-sided polynephritis, patient might have passed a stone recently, continue to monitor closely, continue aggressive hydration, IV antibiotics, IV pain medication,.  Follow urine cultures.    All the records are reviewed and case discussed with ED provider. Management plans discussed with the patient, family and they are in agreement.  CODE STATUS;full  TOTAL TIME TAKING CARE OF THIS PATIENT:55 minutes.    Katha HammingSnehalatha Erie Sica M.D on 02/02/2019 at 8:18 PM  Between 7am to 6pm - Pager - [phone]  After 6pm go to [email or web address] - password [email or web address]  [facility]  Office  [phone]  CC: Primary care physician; Patient, No Pcp Per  Note: This dictation was prepared with Dragon dictation along with smaller phrase technology. Any transcriptional errors that result from this process are unintentional.

## 2019-02-02 NOTE — ED Notes (Addendum)
2nd failed attempt to collect a set of blood cultures (right upper forearm). Antibiotics have been ordered since 1645 and will be started at this time.

## 2019-02-02 NOTE — ED Triage Notes (Signed)
Pt c/o lower abd pain that radiates into BL flank for the past 2-3 days with painful urination. Denies N/V/D.Marland Kitchen

## 2019-02-03 LAB — URINE DRUG SCREEN, QUALITATIVE (ARMC ONLY)
Amphetamines, Ur Screen: POSITIVE — AB
Barbiturates, Ur Screen: NOT DETECTED
Benzodiazepine, Ur Scrn: NOT DETECTED
Cannabinoid 50 Ng, Ur ~~LOC~~: NOT DETECTED
Cocaine Metabolite,Ur ~~LOC~~: NOT DETECTED
MDMA (Ecstasy)Ur Screen: NOT DETECTED
Methadone Scn, Ur: NOT DETECTED
Opiate, Ur Screen: POSITIVE — AB
Phencyclidine (PCP) Ur S: NOT DETECTED
Tricyclic, Ur Screen: NOT DETECTED

## 2019-02-03 LAB — GLUCOSE, CAPILLARY: Glucose-Capillary: 88 mg/dL (ref 70–99)

## 2019-02-03 LAB — BASIC METABOLIC PANEL
Anion gap: 7 (ref 5–15)
BUN: 5 mg/dL — ABNORMAL LOW (ref 6–20)
CO2: 26 mmol/L (ref 22–32)
Calcium: 7.7 mg/dL — ABNORMAL LOW (ref 8.9–10.3)
Chloride: 102 mmol/L (ref 98–111)
Creatinine, Ser: 0.74 mg/dL (ref 0.44–1.00)
GFR calc Af Amer: >60 mL/min (ref >60)
GFR calc non Af Amer: >60 mL/min (ref >60)
Glucose, Bld: 114 mg/dL — ABNORMAL HIGH (ref 70–99)
Potassium: 2.9 mmol/L — ABNORMAL LOW (ref 3.5–5.1)
Sodium: 135 mmol/L (ref 135–145)

## 2019-02-03 LAB — CBC
HCT: 28 % — ABNORMAL LOW (ref 36.0–46.0)
Hemoglobin: 8.7 g/dL — ABNORMAL LOW (ref 12.0–15.0)
MCH: 24.4 pg — ABNORMAL LOW (ref 26.0–34.0)
MCHC: 31.1 g/dL (ref 30.0–36.0)
MCV: 78.4 fL — ABNORMAL LOW (ref 80.0–100.0)
Platelets: 286 10*3/uL (ref 150–400)
RBC: 3.57 MIL/uL — ABNORMAL LOW (ref 3.87–5.11)
RDW: 20.3 % — ABNORMAL HIGH (ref 11.5–15.5)
WBC: 10.5 10*3/uL (ref 4.0–10.5)
nRBC: 0 % (ref 0.0–0.2)

## 2019-02-03 LAB — HIV ANTIBODY (ROUTINE TESTING W REFLEX): HIV Screen 4th Generation wRfx: NONREACTIVE

## 2019-02-03 LAB — SARS CORONAVIRUS 2 (TAT 6-24 HRS): SARS Coronavirus 2: NEGATIVE

## 2019-02-03 LAB — LACTIC ACID, PLASMA: Lactic Acid, Venous: 0.8 mmol/L (ref 0.5–1.9)

## 2019-02-03 MED ORDER — POTASSIUM CHLORIDE CRYS ER 20 MEQ PO TBCR
40.0000 meq | EXTENDED_RELEASE_TABLET | Freq: Once | ORAL | Status: AC
  Administered 2019-02-03: 08:00:00 40 meq via ORAL
  Filled 2019-02-03: qty 2

## 2019-02-03 MED ORDER — SODIUM CHLORIDE 0.9 % IV BOLUS
1000.0000 mL | Freq: Once | INTRAVENOUS | Status: AC
  Administered 2019-02-03: 10:00:00 1000 mL via INTRAVENOUS

## 2019-02-03 MED ORDER — POTASSIUM CHLORIDE IN NACL 20-0.9 MEQ/L-% IV SOLN
INTRAVENOUS | Status: DC
  Administered 2019-02-03 – 2019-02-04 (×3): via INTRAVENOUS
  Filled 2019-02-03 (×6): qty 1000

## 2019-02-03 NOTE — Progress Notes (Signed)
Advanced care plan. Purpose of the Encounter: CODE STATUS Parties in Attendance:Patient Patient's Decision Capacity:Good Subjective/Patient's story: Mary Klein  is a 38 y.o. female with no medical history comes in because of severe right flank pain going on for 3 days, fever.  Noted to have right-sided pyonephritis with UTI, mild right hydronephrosis.  Patient has severe right-sided pain, received a morphine, Toradol, Rocephin in the emergency room, admitting the patient for sepsis with right-sided pyonephritis. Objective/Medical story Patient needed work-up with CT scan and needs IV antibiotics Follow-up cultures and IV fluids Goals of care determination:  Advance care directives goals of care treatment plan discussed For now patient wants everything done which includes CPR, intubation ventilator if the need arises CODE STATUS: Full code Time spent discussing advanced care planning: 16 minutes

## 2019-02-03 NOTE — Progress Notes (Signed)
Bolus dose of normal saline (1L) given. Tylenol given for fever of 102.1. Urine drug screen obtained. Voiding. Tolerating full liquid diet. Pain continues to be and issue. Soft BP's.

## 2019-02-03 NOTE — Progress Notes (Signed)
[place] [facility] [place] NAME: Mary Klein    MR#:  [id number]  DATE OF BIRTH:  04/12/1981  SUBJECTIVE:  CHIEF COMPLAINT:   Chief Complaint  Patient presents with  . Urinary Frequency  . Abdominal Pain  Patient seen and evaluated today Has fever Mild flank pain Has some chills No complaints of chest pain  REVIEW OF SYSTEMS:    ROS  CONSTITUTIONAL: Has fever. Has fatigue, weakness. No weight gain, no weight loss.  EYES: No blurry or double vision.  ENT: No tinnitus. No postnasal drip. No redness of the oropharynx.  RESPIRATORY: No cough, no wheeze, no hemoptysis. No dyspnea.  CARDIOVASCULAR: No chest pain. No orthopnea. No palpitations. No syncope.  GASTROINTESTINAL: No nausea, no vomiting or diarrhea. No abdominal pain. No melena or hematochezia.  GENITOURINARY: Has dysuria, no hematuria.  Flank pain ENDOCRINE: No polyuria or nocturia. No heat or cold intolerance.  HEMATOLOGY: No anemia. No bruising. No bleeding.  INTEGUMENTARY: No rashes. No lesions.  MUSCULOSKELETAL: No arthritis. No swelling. No gout.  NEUROLOGIC: No numbness, tingling, or ataxia. No seizure-type activity.  PSYCHIATRIC: No anxiety. No insomnia. No ADD.   DRUG ALLERGIES:  No Known Allergies  VITALS:  Blood pressure 114/70, pulse 99, temperature 98.5 F (36.9 C), temperature source Oral, resp. rate 20, height 5\' 4"  (1.626 m), weight 93.1 kg, last menstrual period 01/26/2019, SpO2 96 %.  PHYSICAL EXAMINATION:   Physical Exam  GENERAL:  38 y.o.-year-old patient lying in the bed with no acute distress.  EYES: Pupils equal, round, reactive to light and accommodation. No scleral icterus. Extraocular muscles intact.  HEENT: Head atraumatic, normocephalic. Oropharynx and nasopharynx clear.  NECK:  Supple, no jugular venous distention. No thyroid enlargement, no tenderness.  LUNGS: Normal breath sounds bilaterally, no wheezing, rales, rhonchi. No use of  accessory muscles of respiration.  CARDIOVASCULAR: S1, S2 normal. No murmurs, rubs, or gallops.  ABDOMEN: Soft, mild tenderness left flank, nondistended. Bowel sounds present. No organomegaly or mass.  EXTREMITIES: No cyanosis, clubbing or edema b/l.    NEUROLOGIC: Cranial nerves II through XII are intact. No focal Motor or sensory deficits b/l.   PSYCHIATRIC: The patient is alert and oriented x 3.  SKIN: No obvious rash, lesion, or ulcer.   LABORATORY PANEL:   CBC Recent Labs  Lab 02/03/19 0541  WBC 10.5  HGB 8.7*  HCT 28.0*  PLT 286   ------------------------------------------------------------------------------------------------------------------ Chemistries  Recent Labs  Lab 02/02/19 1534 02/03/19 0541  NA 133* 135  K 4.2 2.9*  CL 95* 102  CO2 25 26  GLUCOSE 98 114*  BUN 9 5*  CREATININE 0.71 0.74  CALCIUM 8.5* 7.7*  AST 19  --   ALT 17  --   ALKPHOS 87  --   BILITOT 0.5  --    ------------------------------------------------------------------------------------------------------------------  Cardiac Enzymes No results for input(s): TROPONINI in the last 168 hours. ------------------------------------------------------------------------------------------------------------------  RADIOLOGY:  Ct Renal Stone Study  Result Date: 02/02/2019 CLINICAL DATA:  Flank pain and dysuria EXAM: CT ABDOMEN AND PELVIS WITHOUT CONTRAST TECHNIQUE: Multidetector CT imaging of the abdomen and pelvis was performed following the standard protocol without oral or IV contrast. COMPARISON:  None. FINDINGS: Lower chest: Lung bases are clear. Hepatobiliary: Liver measures 21.4 cm in length. No focal liver lesions are evident on this noncontrast enhanced study. Gallbladder wall is not appreciably thickened. There is no biliary duct dilatation. Pancreas: No pancreatic mass or inflammatory focus. Spleen: No splenic lesions are evident. Adrenals/Urinary  Tract: Adrenals bilaterally appear  unremarkable. The right kidney is edematous with perinephric stranding. There is no evident renal mass on either side. There is mild hydronephrosis on the right. There is no appreciable hydronephrosis on the left. There is a 1 mm calculus in the mid right kidney. There is no intrarenal calculus on the left. There is no appreciable ureteral calculus on either side. Urinary bladder is midline with wall thickness within normal limits. Note that there are phleboliths near but felt to be separate from each distal ureter. Stomach/Bowel: There is no appreciable bowel wall or mesenteric thickening. There is no evident bowel obstruction. There is a 1.8 x 1.6 cm lipoma at the terminal ileum-cecum junction. This lipoma appears to be slightly proximal to the expected level of the ileocecal valve. No free air or portal venous air evident. Vascular/Lymphatic: There is no abdominal aortic aneurysm. No vascular lesions are evident on this noncontrast enhanced study. There is no appreciable adenopathy in the abdomen or pelvis. Reproductive: Uterus is anteverted. There are follicles in each ovary. Beyond these follicles, there is no extrauterine pelvic mass. Tampon present. Other: Appendix appears unremarkable. There is no abscess or ascites in abdomen or pelvis. Musculoskeletal: There is multilevel degenerative change in the lumbar spine. There is a nitrogen containing cystic area in the L3 vertebral body measuring 1.3 x 1.0 cm. No blastic or lytic bone lesions. No intramuscular or abdominal wall lesions are evident. IMPRESSION: 1. The right kidney is edematous with perinephric stranding. There is mild hydronephrosis and ureteral prominence on the right. No obstructing focus is evident. Question recent calculus passage on the right. Pyelonephritis is a differential consideration given this appearance. Note that urinary bladder wall thickness is within normal limits. 2. Benign lipoma in the distal most aspect of the ileum near the  junction with the cecum. No bowel obstruction. No abscess in the abdomen or pelvis. Appendix appears normal. 3.  Prominent liver without focal liver lesion evident. 4.  Extensive arthropathy throughout the lumbar spine. Electronically Signed   By: Bretta Bang III M.D.   On: 02/02/2019 16:56     ASSESSMENT AND PLAN:   38 year old female patient with no significant past medical history currently under hospitalist service  -Acute pyelonephritis Continue IV Rocephin antibiotic Follow-up cultures Has hydronephrosis but no obstruction IV fluids  -Systemic inflammatory response syndrome secondary to pyelonephritis Aggressive IV fluids and culture follow-up Antibiotics intravenously  -Flank pain secondary to pyelonephritis IV Dilaudid for pain as needed  -Tobacco abuse Tobacco cessation counseled to the patient for 6 minutes Refused nicotine patch  All the records are reviewed and case discussed with Care Management/Social Worker. Management plans discussed with the patient, family and they are in agreement.  CODE STATUS: Full code  DVT Prophylaxis: SCDs  TOTAL TIME TAKING CARE OF THIS PATIENT: 38 minutes.   POSSIBLE D/C IN 1 to 2 DAYS, DEPENDING ON CLINICAL CONDITION.  Ihor Austin M.D on 02/03/2019 at 10:22 AM  Between 7am to 6pm - Pager - [phone]  After 6pm go to [email or web address] - password [email or web address]  [facility]  Office  [phone]  CC: Primary care physician; Patient, No Pcp Per  Note: This dictation was prepared with Dragon dictation along with smaller phrase technology. Any transcriptional errors that result from this process are unintentional.

## 2019-02-03 NOTE — Progress Notes (Signed)
MD notified: Temp 102.1 this morning, tachycardic 99-106bpm. Tylenol given.

## 2019-02-04 LAB — BASIC METABOLIC PANEL
Anion gap: 8 (ref 5–15)
BUN: <5 mg/dL — ABNORMAL LOW (ref 6–20)
CO2: 25 mmol/L (ref 22–32)
Calcium: 7.6 mg/dL — ABNORMAL LOW (ref 8.9–10.3)
Chloride: 108 mmol/L (ref 98–111)
Creatinine, Ser: 0.65 mg/dL (ref 0.44–1.00)
GFR calc Af Amer: >60 mL/min (ref >60)
GFR calc non Af Amer: >60 mL/min (ref >60)
Glucose, Bld: 95 mg/dL (ref 70–99)
Potassium: 3.3 mmol/L — ABNORMAL LOW (ref 3.5–5.1)
Sodium: 141 mmol/L (ref 135–145)

## 2019-02-04 LAB — URINE CULTURE: Culture: >=100000 — AB

## 2019-02-04 LAB — GLUCOSE, CAPILLARY: Glucose-Capillary: 87 mg/dL (ref 70–99)

## 2019-02-04 MED ORDER — HYDROCODONE-ACETAMINOPHEN 5-325 MG PO TABS: 1.0000 | ORAL_TABLET | Freq: Four times a day (QID) | ORAL | 0 refills | Status: DC | PRN

## 2019-02-04 MED ORDER — CIPROFLOXACIN HCL 500 MG PO TABS: 500.0000 mg | ORAL_TABLET | Freq: Two times a day (BID) | ORAL | 0 refills | Status: AC

## 2019-02-04 MED ORDER — POTASSIUM CHLORIDE CRYS ER 20 MEQ PO TBCR
40.0000 meq | EXTENDED_RELEASE_TABLET | Freq: Once | ORAL | Status: AC
  Administered 2019-02-04: 40 meq via ORAL
  Filled 2019-02-04: qty 2

## 2019-02-04 NOTE — Discharge Summary (Signed)
[facility]   PATIENT NAME: Mary Klein    MR#:  [id number]  DATE OF BIRTH:  1980-08-17  DATE OF ADMISSION:  02/02/2019 ADMITTING PHYSICIAN: Katha Hamming, MD  DATE OF DISCHARGE: 02/04/2019  PRIMARY CARE PHYSICIAN: Patient, No Pcp Per   ADMISSION DIAGNOSIS:  Pyelonephritis [N12]  DISCHARGE DIAGNOSIS:  Active Problems:   Acute pyelonephritis E. coli urinary tract infection Sepsis secondary to pyelonephritis Hypokalemia  SECONDARY DIAGNOSIS:  History reviewed. No pertinent past medical history.   ADMITTING HISTORY Mary Klein  is a 38 y.o. female with no medical history comes in because of severe right flank pain going on for 3 days, fever.  Noted to have right-sided pyonephritis with UTI, mild right hydronephrosis.  Patient has severe right-sided pain, received a morphine, Toradol, Rocephin in the emergency room, admitting the patient for sepsis with right-sided pyonephritis.   HOSPITAL COURSE:  Patient was admitted to medical floor started on IV Rocephin antibiotic and received IV fluids for sepsis.  Blood cultures did not reveal any growth.  Urine culture grew E. coli sensitive to IV antibiotic.  Patient's fever curve came down.  Blood pressures also improved.  Tolerated IV antibiotics well.  Abdominal and flank pain also improved.  During hospitalization patient was worked up with CT renal stone study.  CT renal stone study showed no obstruction but pyelonephritis and antibiotics were given.  CONSULTS OBTAINED:   None DRUG ALLERGIES:  No Known Allergies  DISCHARGE MEDICATIONS:   Allergies as of 02/04/2019   No Known Allergies     Medication List    STOP taking these medications   amoxicillin 500 MG capsule Commonly known as: AMOXIL   cephALEXin 500 MG capsule Commonly known as: KEFLEX   lidocaine 2 % solution Commonly known as: XYLOCAINE   predniSONE 50 MG tablet Commonly known as: DELTASONE      TAKE these medications   ciprofloxacin 500 MG tablet Commonly known as: Cipro Take 1 tablet (500 mg total) by mouth 2 (two) times daily for 5 days.   HYDROcodone-acetaminophen 5-325 MG tablet Commonly known as: NORCO/VICODIN Take 1-2 tablets by mouth every 6 (six) hours as needed for moderate pain or severe pain. What changed:   how much to take  reasons to take this       Today  Patient seen and evaluated today Tolerated antibiotics well Fever improved Hemodynamically stable VITAL SIGNS:  Blood pressure 98/67, pulse 93, temperature 98.4 F (36.9 C), temperature source Oral, resp. rate (!) 22, height  (1.626 m), weight 93.1 kg, last menstrual period 01/26/2019, SpO2 95 %.  I/O:    Intake/Output Summary (Last 24 hours) at 02/04/2019 1126 Last data filed at 02/04/2019 0544 Gross per 24 hour  Intake 2211.81 ml  Output 1400 ml  Net 811.81 ml    PHYSICAL EXAMINATION:  Physical Exam  GENERAL:  38 y.o.-year-old patient lying in the bed with no acute distress.  LUNGS: Normal breath sounds bilaterally, no wheezing, rales,rhonchi or crepitation. No use of accessory muscles of respiration.  CARDIOVASCULAR: S1, S2 normal. No murmurs, rubs, or gallops.  ABDOMEN: Soft, non-tender, non-distended. Bowel sounds present. No organomegaly or mass.  NEUROLOGIC: Moves all 4 extremities. PSYCHIATRIC: The patient is alert and oriented x 3.  SKIN: No obvious rash, lesion, or ulcer.   DATA REVIEW:   CBC Recent Labs  Lab 02/03/19 0541  WBC 10.5  HGB 8.7*  HCT 28.0*  PLT 286    Chemistries  Recent Labs  Lab 02/02/19 1534  02/04/19 0448  NA 133*   < > 141  K 4.2   < > 3.3*  CL 95*   < > 108  CO2 25   < > 25  GLUCOSE 98   < > 95  BUN 9   < > <5*  CREATININE 0.71   < > 0.65  CALCIUM 8.5*   < > 7.6*  AST 19  --   --   ALT 17  --   --   ALKPHOS 87  --   --   BILITOT 0.5  --   --    < > = values in this interval not displayed.    Cardiac Enzymes No results for  input(s): TROPONINI in the last 168 hours.  Microbiology Results  Results for orders placed or performed during the hospital encounter of 02/02/19  Urine culture     Status: Abnormal   Collection Time: 02/02/19  3:34 PM   Specimen: Urine, Clean Catch  Result Value Ref Range Status   Specimen Description   Final    URINE, CLEAN CATCH Performed at [facility], [address], [place], [place] [address]    Special Requests   Final    NONE Performed at [facility], [place], [place] [address]    Culture >=100,000 COLONIES/mL ESCHERICHIA COLI (A)  Final   Report Status 02/04/2019 FINAL  Final   Organism ID, Bacteria ESCHERICHIA COLI (A)  Final      Susceptibility   Escherichia coli - MIC*    AMPICILLIN >=32 RESISTANT Resistant     CEFAZOLIN <=4 SENSITIVE Sensitive     CEFTRIAXONE <=1 SENSITIVE Sensitive     CIPROFLOXACIN <=0.25 SENSITIVE Sensitive     GENTAMICIN <=1 SENSITIVE Sensitive     IMIPENEM <=0.25 SENSITIVE Sensitive     NITROFURANTOIN <=16 SENSITIVE Sensitive     TRIMETH/SULFA >=320 RESISTANT Resistant     AMPICILLIN/SULBACTAM >=32 RESISTANT Resistant     PIP/TAZO <=4 SENSITIVE Sensitive     Extended ESBL NEGATIVE Sensitive     * >=100,000 COLONIES/mL ESCHERICHIA COLI  SARS CORONAVIRUS 2 (TAT 6-24 HRS) Nasopharyngeal Nasopharyngeal Swab     Status: None   Collection Time: 02/02/19  6:55 PM   Specimen: Nasopharyngeal Swab  Result Value Ref Range Status   SARS Coronavirus 2 NEGATIVE NEGATIVE Final    Comment: (NOTE) SARS-CoV-2 target nucleic acids are NOT DETECTED. The SARS-CoV-2 RNA is generally detectable in upper and lower respiratory specimens during the acute phase of infection. Negative results do not preclude SARS-CoV-2 infection, do not rule out co-infections with other pathogens, and should not be used as the sole basis for treatment or other patient management decisions. Negative results must be combined with clinical  observations, patient history, and epidemiological information. The expected result is Negative. Fact Sheet for Patients: SugarRoll.be Fact Sheet for Healthcare Providers: https://www.woods-mathews.com/ This test is not yet approved or cleared by the [place] FDA and  has been authorized for detection and/or diagnosis of SARS-CoV-2 by FDA under an Emergency Use Authorization (EUA). This EUA will remain  in effect (meaning this test can be used) for the duration of the COVID-19 declaration under Section 56 4(b)(1) of the Act, 21 U.S.C. section 360bbb-3(b)(1), unless the authorization is terminated or revoked sooner. Performed at [facility], [place] [address] [place], [place] [address]   Blood culture (routine x 2)     Status: None (Preliminary result)   Collection  Time: 02/03/19  5:40 AM   Specimen: BLOOD  Result Value Ref Range Status   Specimen Description BLOOD BLOOD LEFT WRIST  Final   Special Requests   Final    BOTTLES DRAWN AEROBIC ONLY Blood Culture adequate volume   Culture   Final    NO GROWTH 1 DAY Performed at [facility], [address] [place], [place] [address]    Report Status PENDING  Incomplete  Blood culture (routine x 2)     Status: None (Preliminary result)   Collection Time: 02/03/19  5:42 AM   Specimen: BLOOD  Result Value Ref Range Status   Specimen Description BLOOD BLOOD RIGHT ARM  Final   Special Requests   Final    BOTTLES DRAWN AEROBIC ONLY Blood Culture adequate volume   Culture   Final    NO GROWTH 1 DAY Performed at [facility], [address] [place], [place] [address]    Report Status PENDING  Incomplete    RADIOLOGY:  Ct Renal Stone Study  Result Date: 02/02/2019 CLINICAL DATA:  Flank pain and dysuria EXAM: CT ABDOMEN AND PELVIS WITHOUT CONTRAST TECHNIQUE: Multidetector CT imaging of the abdomen and pelvis was performed following the standard protocol without  oral or IV contrast. COMPARISON:  None. FINDINGS: Lower chest: Lung bases are clear. Hepatobiliary: Liver measures 21.4 cm in length. No focal liver lesions are evident on this noncontrast enhanced study. Gallbladder wall is not appreciably thickened. There is no biliary duct dilatation. Pancreas: No pancreatic mass or inflammatory focus. Spleen: No splenic lesions are evident. Adrenals/Urinary Tract: Adrenals bilaterally appear unremarkable. The right kidney is edematous with perinephric stranding. There is no evident renal mass on either side. There is mild hydronephrosis on the right. There is no appreciable hydronephrosis on the left. There is a 1 mm calculus in the mid right kidney. There is no intrarenal calculus on the left. There is no appreciable ureteral calculus on either side. Urinary bladder is midline with wall thickness within normal limits. Note that there are phleboliths near but felt to be separate from each distal ureter. Stomach/Bowel: There is no appreciable bowel wall or mesenteric thickening. There is no evident bowel obstruction. There is a 1.8 x 1.6 cm lipoma at the terminal ileum-cecum junction. This lipoma appears to be slightly proximal to the expected level of the ileocecal valve. No free air or portal venous air evident. Vascular/Lymphatic: There is no abdominal aortic aneurysm. No vascular lesions are evident on this noncontrast enhanced study. There is no appreciable adenopathy in the abdomen or pelvis. Reproductive: Uterus is anteverted. There are follicles in each ovary. Beyond these follicles, there is no extrauterine pelvic mass. Tampon present. Other: Appendix appears unremarkable. There is no abscess or ascites in abdomen or pelvis. Musculoskeletal: There is multilevel degenerative change in the lumbar spine. There is a nitrogen containing cystic area in the L3 vertebral body measuring 1.3 x 1.0 cm. No blastic or lytic bone lesions. No intramuscular or abdominal wall lesions are  evident. IMPRESSION: 1. The right kidney is edematous with perinephric stranding. There is mild hydronephrosis and ureteral prominence on the right. No obstructing focus is evident. Question recent calculus passage on the right. Pyelonephritis is a differential consideration given this appearance. Note that urinary bladder wall thickness is within normal limits. 2. 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 No bowel obstruction. No abscess in the abdomen or pelvis. Appendix appears normal. 3.  Prominent  liver without focal liver lesion evident. 4.  Extensive arthropathy throughout the lumbar spine. Electronically Signed   By: Bretta BangWilliam  Woodruff III M.D.   On: 02/02/2019 16:56    Follow up with PCP in 1 week.  Management plans discussed with the patient, family and they are in agreement.  CODE STATUS: Full code    Code Status Orders  (From admission, onward)         Start     Ordered   02/02/19 2102  Full code  Continuous     02/02/19 2102        Code Status History    This patient has a current code status but no historical code status.   Advance Care Planning Activity      TOTAL TIME TAKING CARE OF THIS PATIENT ON DAY OF DISCHARGE: more than 35 minutes.   Ihor AustinPavan Abdikadir Fohl M.D on 02/04/2019 at 11:26 AM  Between 7am to 6pm - Pager - [phone]  After 6pm go to [email or web address] - password [email or web address]  [facility]  Office  [phone]  CC: Primary care physician; Patient, No Pcp Per  Note: This dictation was prepared with Dragon dictation along with smaller phrase technology. Any transcriptional errors that result from this process are unintentional.

## 2019-02-04 NOTE — Progress Notes (Signed)
Pt discharged per MD order. IV removed. Discharge instructions reviewed with pt. Pt verbalized understanding. All questions answered to pt satisfaction. Pt taken to car in wheelchair by staff.  ?

## 2019-02-08 LAB — CULTURE, BLOOD (ROUTINE X 2)
Culture: NO GROWTH
Culture: NO GROWTH
Special Requests: ADEQUATE
Special Requests: ADEQUATE

## 2019-05-09 IMAGING — CT CT MAXILLOFACIAL W/O CM
3 series · 14 of 47 positions shown, 16 images · non-contrast
Comparison: None.

CLINICAL DATA: Patient struck in right orbit with pain and blurred
vision

EXAM:
CT MAXILLOFACIAL WITHOUT CONTRAST
TECHNIQUE: Multidetector CT imaging of the maxillofacial structures was
performed. Multiplanar CT image reconstructions were also generated.
A small metallic BB was placed on the right temple in order to
reliably differentiate right from left.

[Series 2: max soft · axial · 0.33mm/px · z∈[+912,+1038]mm · 8 of 75 slices shown, 10 images]
[im 6/75  brain]
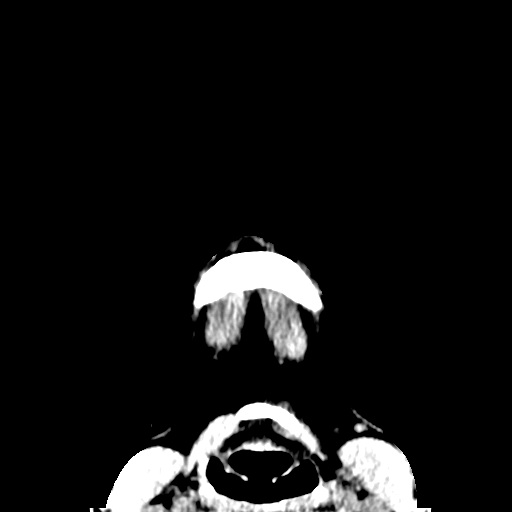
[im 6/75  bone]
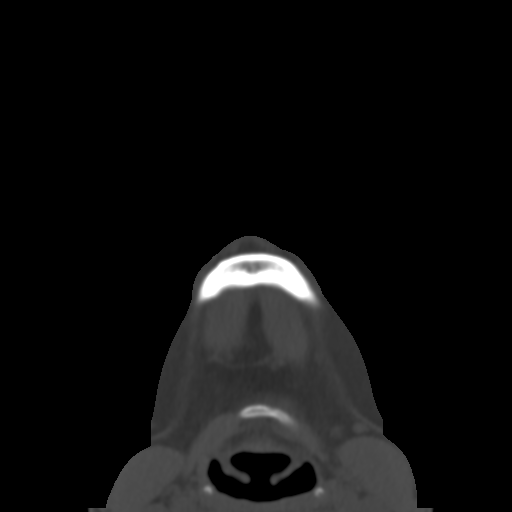
[im 16/75  bone]
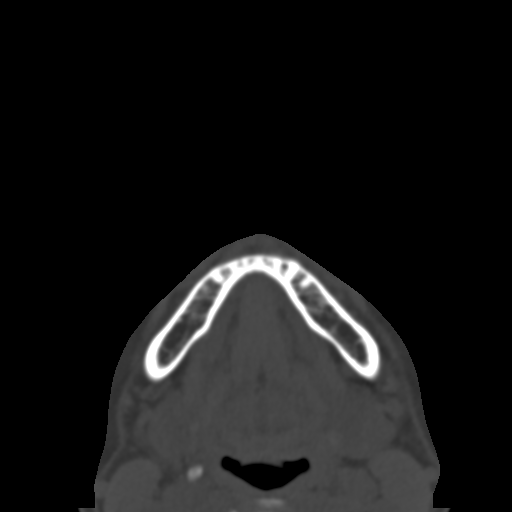
[im 23/75  bone]
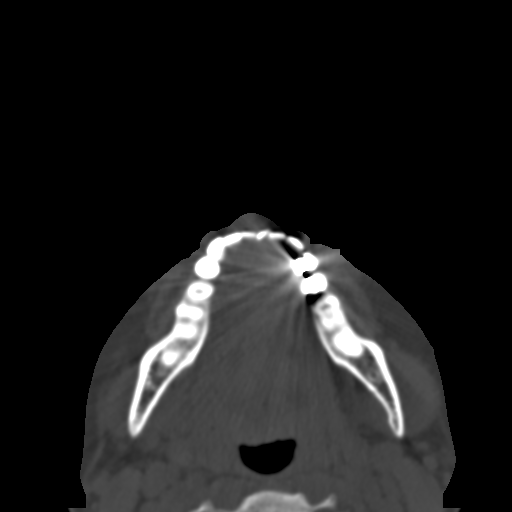
[im 34/75  bone]
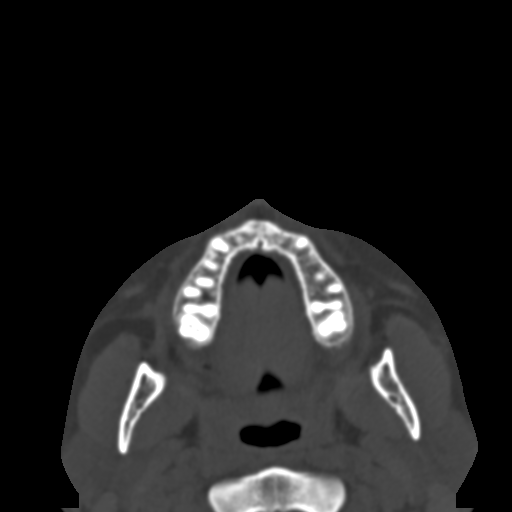
[im 41/75  brain]
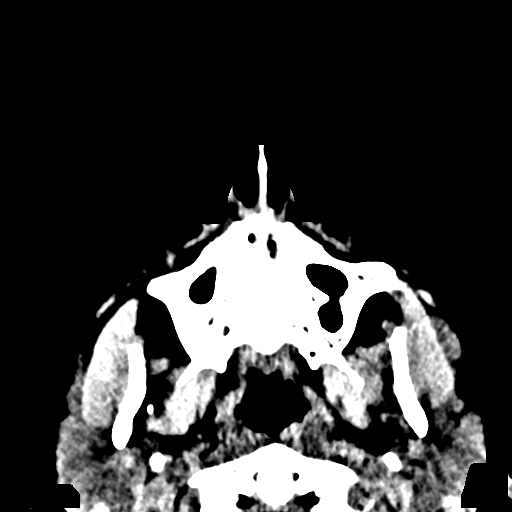
[im 41/75  bone]
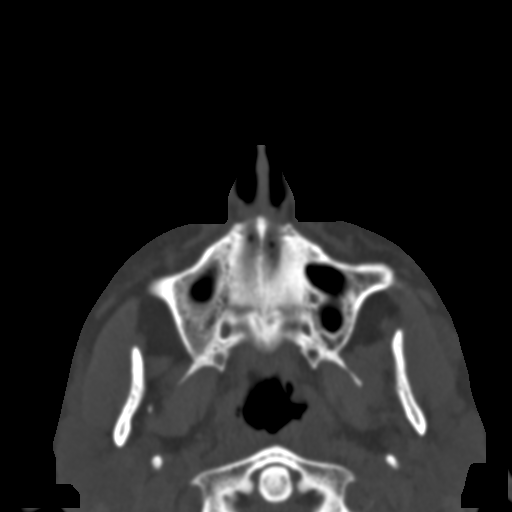
[im 52/75  bone]
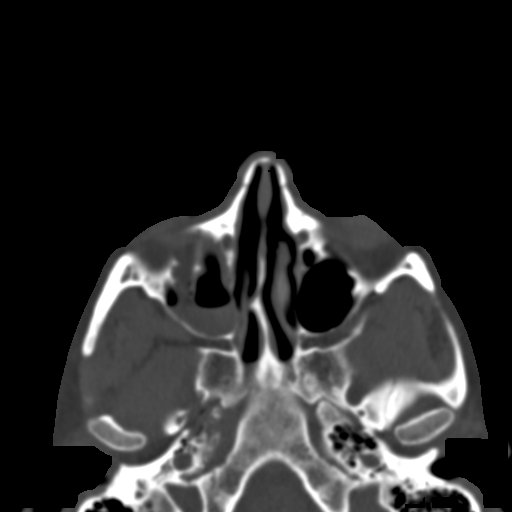
[im 59/75  bone]
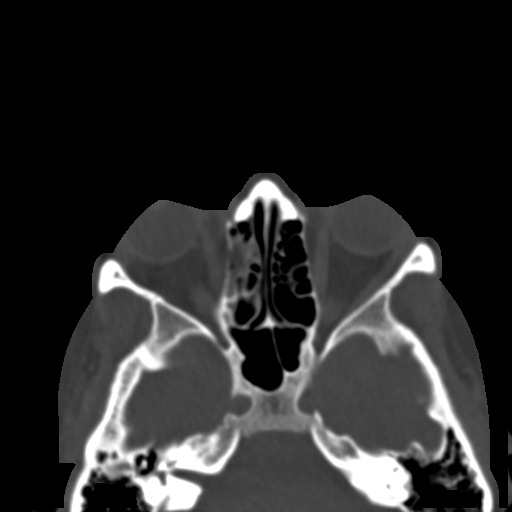
[im 69/75  bone]
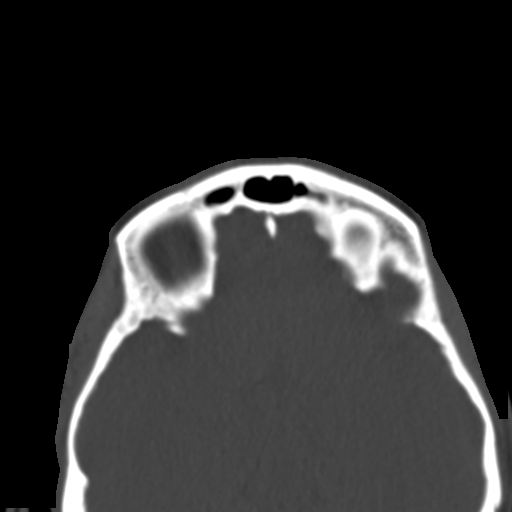

[Series 6: coronal soft · coronal · 0.35mm/px · 3 of 65 slices shown]
[im 22/65  bone]
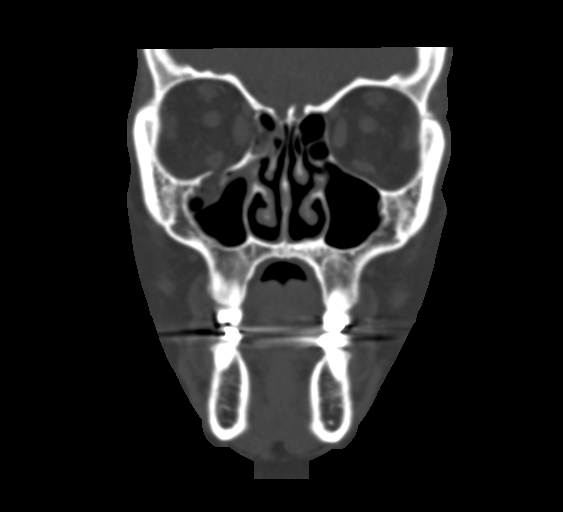
[im 29/65  bone]
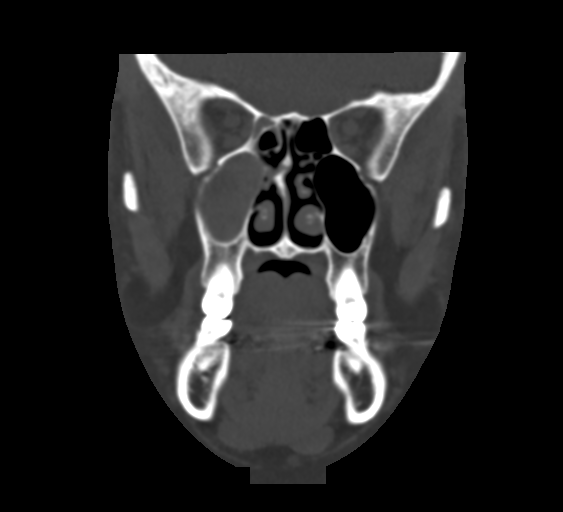
[im 36/65  bone]
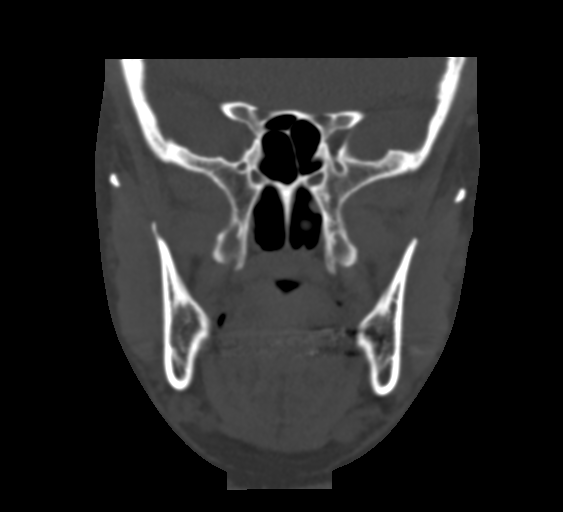

[Series 7: sagittal soft · sagittal · 0.31mm/px · 3 of 79 slices shown]
[im 27/79  bone]
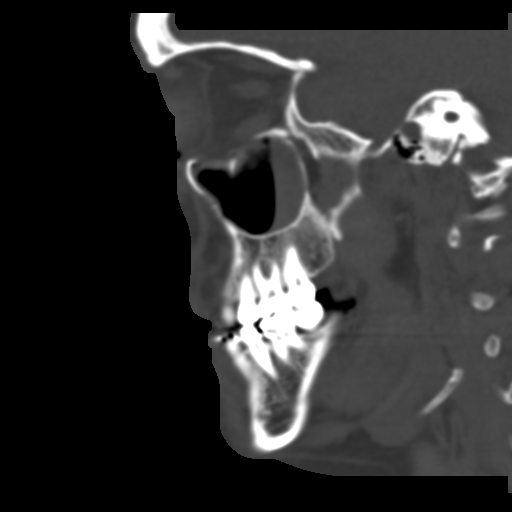
[im 40/79  bone]
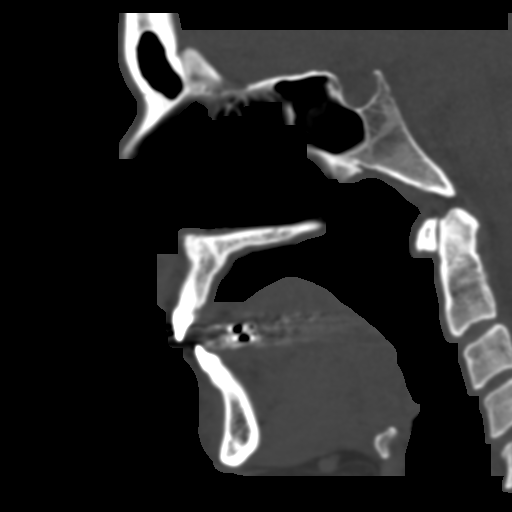
[im 53/79  bone]
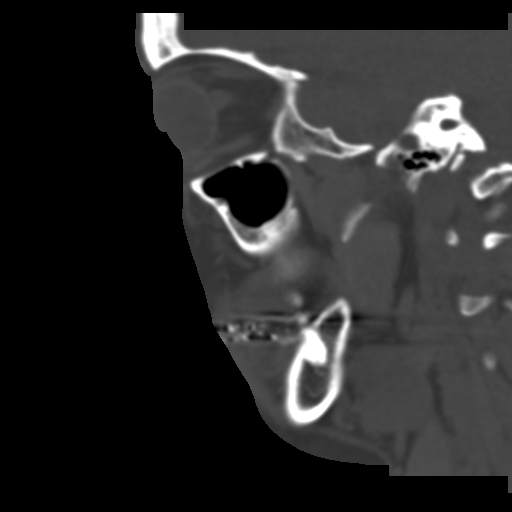

[14 of 47 positions shown; findings below may reference images not displayed]

FINDINGS: Osseous: There is a comminuted right orbital floor depression
fracture. Fat protrudes through this fracture into the superior
right maxillary antrum. No muscular entrapment is noted in the right
orbit. There is a second site of fracture along the posterior most
aspect of the right orbit at the junction of the orbital floor and
posterior right maxillary antral region. Alignment in this area is
near anatomic. There is a comminuted fracture of the medial right
orbital wall no other fractures are evident. No dislocations. No
blastic or lytic bone lesions. With extension of fat into this area
but no muscle entrapment.

Orbits: There is air within the medial and anteromedial aspects of
the right orbit, likely arising from the paranasal sinuses which
communicate with the right orbit. There yet extraocular muscles
appear symmetric and unremarkable in size and contour. There is no
hemorrhage within either orbit. There is no appreciable proptosis.
Optic nerves appear symmetric bilaterally.

Sinuses: There is opacification in multiple ethmoid air cells on the
right as well as in the right maxillary antrum with an air-fluid
level in the right maxillary antrum. These areas likely have
traumatic etiology. There is modest opacification of a midportion
left ethmoid air cells, possibly the hemorrhage but also possibly
due to sinusitis. There is minimal mucosal thickening in the
anterior left sphenoid region. There is obstruction of the
ostiomeatal unit complex on the right, likely due to hemorrhage.
Ostiomeatal unit complex on the left is patent. No nares
obstruction.

Soft tissues: Soft tissues elsewhere appear unremarkable. No
abscess. Salivary glands appear normal bilaterally. No adenopathy.
Tongue and tongue base regions appear normal. Portions of pharynx
which are visualized appear normal.

Limited intracranial: Visualized mastoids are clear. Visualized
brain parenchyma appears unremarkable.
IMPRESSION: Comminuted right orbital floor fracture with protrusion of fat into
the right maxillary antrum superiorly but no muscle entrapment.
Comminuted fracture medial orbital wall on the right with fat
extending into this area but no muscle entrapment. Fracture at the
junction of the right orbital floor and posterior right maxillary
antrum.

Air in the right orbit is felt to be due to the communication of
paranasal sinuses with the orbital region do fractures. This amount
of air is not impressing on surrounding structures. There is no
intraorbital mass or hemorrhage. Extraocular muscles appear
symmetric and unremarkable.

Areas of opacification in right-sided paranasal sinuses, likely due
to hemorrhage. Obstruction of the right ostiomeatal unit complex is
likely due to hemorrhage. Minimal sinus disease to the left of
midline may represent sinusitis as opposed to hemorrhage.

## 2019-08-21 ENCOUNTER — Ambulatory Visit: Payer: Self-pay

## 2019-08-22 ENCOUNTER — Ambulatory Visit: Payer: Self-pay

## 2019-09-07 ENCOUNTER — Ambulatory Visit: Payer: Self-pay

## 2019-11-08 ENCOUNTER — Emergency Department (HOSPITAL_COMMUNITY)
Admission: EM | Admit: 2019-11-08 | Discharge: 2019-11-08 | Disposition: A | Discharge status: Home / Self Care | Payer: Self-pay | Attending: Emergency Medicine | Admitting: Emergency Medicine

## 2019-11-08 ENCOUNTER — Encounter (HOSPITAL_COMMUNITY): Payer: Self-pay

## 2019-11-08 ENCOUNTER — Emergency Department (HOSPITAL_BASED_OUTPATIENT_CLINIC_OR_DEPARTMENT_OTHER): Payer: Self-pay

## 2019-11-08 ENCOUNTER — Other Ambulatory Visit: Payer: Self-pay

## 2019-11-08 DIAGNOSIS — M79604 Pain in right leg: Secondary | ICD-10-CM | POA: Insufficient documentation

## 2019-11-08 DIAGNOSIS — N939 Abnormal uterine and vaginal bleeding, unspecified: Secondary | ICD-10-CM | POA: Insufficient documentation

## 2019-11-08 DIAGNOSIS — R52 Pain, unspecified: Secondary | ICD-10-CM

## 2019-11-08 DIAGNOSIS — F17210 Nicotine dependence, cigarettes, uncomplicated: Secondary | ICD-10-CM | POA: Insufficient documentation

## 2019-11-08 LAB — URINALYSIS, ROUTINE W REFLEX MICROSCOPIC
Bilirubin Urine: NEGATIVE
Glucose, UA: NEGATIVE mg/dL
Ketones, ur: NEGATIVE mg/dL
Nitrite: NEGATIVE
Protein, ur: 30 mg/dL — AB
Specific Gravity, Urine: 1.016 (ref 1.005–1.030)
pH: 5 (ref 5.0–8.0)

## 2019-11-08 LAB — WET PREP, GENITAL
Sperm: NONE SEEN
Trich, Wet Prep: NONE SEEN
WBC, Wet Prep HPF POC: NONE SEEN
Yeast Wet Prep HPF POC: NONE SEEN

## 2019-11-08 LAB — I-STAT CHEM 8, ED
BUN: 15 mg/dL (ref 6–20)
Calcium, Ion: 1.26 mmol/L (ref 1.15–1.40)
Chloride: 104 mmol/L (ref 98–111)
Creatinine, Ser: 1 mg/dL (ref 0.44–1.00)
Glucose, Bld: 93 mg/dL (ref 70–99)
HCT: 38 % (ref 36.0–46.0)
Hemoglobin: 12.9 g/dL (ref 12.0–15.0)
Potassium: 4.6 mmol/L (ref 3.5–5.1)
Sodium: 138 mmol/L (ref 135–145)
TCO2: 25 mmol/L (ref 22–32)

## 2019-11-08 LAB — I-STAT BETA HCG BLOOD, ED (MC, WL, AP ONLY): I-stat hCG, quantitative: <5 m[IU]/mL (ref <5)

## 2019-11-08 MED ORDER — FENTANYL CITRATE (PF) 100 MCG/2ML IJ SOLN: 50.0000 ug | Freq: Once | INTRAMUSCULAR | Status: DC

## 2019-11-08 MED ORDER — ACETAMINOPHEN 500 MG PO TABS
1000.0000 mg | ORAL_TABLET | Freq: Once | ORAL | Status: AC
  Administered 2019-11-08: 1000 mg via ORAL
  Filled 2019-11-08: qty 2

## 2019-11-08 MED ORDER — IBUPROFEN 600 MG PO TABS: 600.0000 mg | ORAL_TABLET | Freq: Four times a day (QID) | ORAL | 0 refills | Status: AC | PRN

## 2019-11-08 MED ORDER — METHOCARBAMOL 500 MG PO TABS: 500.0000 mg | ORAL_TABLET | Freq: Two times a day (BID) | ORAL | 0 refills | Status: DC

## 2019-11-08 MED ORDER — KETOROLAC TROMETHAMINE 30 MG/ML IJ SOLN
30.0000 mg | Freq: Once | INTRAMUSCULAR | Status: AC
  Administered 2019-11-08: 30 mg via INTRAMUSCULAR
  Filled 2019-11-08: qty 1

## 2019-11-08 NOTE — Progress Notes (Signed)
Lower extremity venous RT study completed.   See Cv Proc for preliminary results.   Demarus Latterell  

## 2019-11-08 NOTE — ED Provider Notes (Signed)
Accepted handoff at shift change from Cortni C PA-C. Please see prior provider note for more detail.   Briefly: Patient is 39 y.o. with multiple complaints including bilateral leg pain and vaginal bleeding.  Per prior provider on physical exam patient had no discernible vaginal bleeding.  She was denying any dysuria, frequency or urgency.  She does have some lower abdominal discomfort which she describes as crampy.  In terms of the patient's leg pain she states that she has had bilateral eye pain 3010 days ago but now only endorses right leg pain.  This pain radiates in the posterior leg she has tried some over-the-counter therapy for relief.  She has a history of sciatica and low back pain.  She is without any neurologic symptoms.  No leg swelling, hemoptysis, shortness of breath or chest pain.  Low suspicion for VTE per prior provider however right lower extremity Doppler ultrasound to rule out DVT was ordered.  Plan: Follow-up on DVT study.  Disposition appropriately.     Physical Exam  BP 118/76    Pulse 76    Temp 97.8 F (36.6 C) (Oral)    Resp 18    Ht 5\' 3"  (1.6 m)    Wt 79.4 kg    LMP 11/08/2019    SpO2 98%    BMI 31.00 kg/m   CONSTITUTIONAL:  well-appearing, NAD NEURO:  Alert and oriented x 3, no focal deficits EYES:  pupils equal and reactive ENT/NECK:  trachea midline, no JVD CARDIO:  reg rate, reg rhythm, well-perfused PULM:  None labored breathing GI/GU:  Abdomen non-distended MSK/SPINE:  No gross deformities, no edema SKIN:  no rash obvious, atraumatic, no ecchymosis  PSYCH:  Appropriate speech and behavior  ED Course/Procedures   Clinical Course as of Nov 08 1107  Wed Nov 08, 2019  0905 Lower externally Doppler ultrasound reviewed myself.  There is no evidence of DVT.  Patient given reassurance.  Discharged with naproxen and muscle relaxer.  Summary: RIGHT: - There is no evidence of deep vein thrombosis in the lower extremity.  - No cystic structure found in the  popliteal fossa.  LEFT: - No evidence of common femoral vein obstruction.   [WF]    Clinical Course User Index [WF] 0906, PA    Procedures  Results for orders placed or performed during the hospital encounter of 11/08/19  Wet prep, genital  Result Value Ref Range   Yeast Wet Prep HPF POC NONE SEEN NONE SEEN   Trich, Wet Prep NONE SEEN NONE SEEN   Clue Cells Wet Prep HPF POC PRESENT (A) NONE SEEN   WBC, Wet Prep HPF POC NONE SEEN NONE SEEN   Sperm NONE SEEN   Urinalysis, Routine w reflex microscopic  Result Value Ref Range   Color, Urine YELLOW YELLOW   APPearance CLOUDY (A) CLEAR   Specific Gravity, Urine 1.016 1.005 - 1.030   pH 5.0 5.0 - 8.0   Glucose, UA NEGATIVE NEGATIVE mg/dL   Hgb urine dipstick LARGE (A) NEGATIVE   Bilirubin Urine NEGATIVE NEGATIVE   Ketones, ur NEGATIVE NEGATIVE mg/dL   Protein, ur 30 (A) NEGATIVE mg/dL   Nitrite NEGATIVE NEGATIVE   Leukocytes,Ua TRACE (A) NEGATIVE   RBC / HPF 0-5 0 - 5 RBC/hpf   WBC, UA 6-10 0 - 5 WBC/hpf   Bacteria, UA RARE (A) NONE SEEN   Squamous Epithelial / LPF 6-10 0 - 5  I-Stat Beta hCG blood, ED ([facility], [facility], [facility] only)  Result Value  Ref Range   I-stat hCG, quantitative <5.0 <5 mIU/mL   Comment 3          I-stat chem 8, ED (not at [facility] or [facility])  Result Value Ref Range   Sodium 138 135 - 145 mmol/L   Potassium 4.6 3.5 - 5.1 mmol/L   Chloride 104 98 - 111 mmol/L   BUN 15 6 - 20 mg/dL   Creatinine, Ser 9.29 0.44 - 1.00 mg/dL   Glucose, Bld 93 70 - 99 mg/dL   Calcium, Ion 2.44 6.28 - 1.40 mmol/L   TCO2 25 22 - 32 mmol/L   Hemoglobin 12.9 12.0 - 15.0 g/dL   HCT 63.8 36 - 46 %   No results found.   MDM   Patient had a CT scan that was negative for DVT.  I reassessed the patient he was sleeping in bed.  She understands discharge instructions and Tylenol and ibuprofen dosing.  She was also prescribed a muscle relaxant by prior provider.  I provided patient with information on this medication she is  understanding of plan to discharge.  I viewed patient's lab work including urinalysis, wet prep, blood work and the studies are without acute abnormality.  She will follow up with her primary care doctor.       Solon [place], [place] 11/08/19 1112    Jacalyn Lefevre, MD 11/08/19 1346

## 2019-11-08 NOTE — ED Triage Notes (Signed)
Patient states she has been on her period for 18 days and she had bilateral leg pain for the same amount of time. States it is now just her right leg that had pain shooting down the back of her leg. Reports passing large clots causing her to bleed through her tampon per hour.

## 2019-11-08 NOTE — ED Provider Notes (Addendum)
[facility]-EMERGENCY DEPT Provider Note   CSN: [id number] Arrival date & time: 11/08/19  0219     History Chief Complaint  Patient presents with  . Leg Pain    Mary Klein is a 39 y.o. female.  HPI   Pt is a 39 y/o female who presents to the ED today with multiple complaints including bilat leg pain and vaginal bleeding.    Vaginal bleeding: States she has had vaginal bleeding for the last 18 days. States she is using 18-20 tampons every day. States she is passing clots. She normally has heavy menstrual cycles but they normally do not last for more than 5 days. Denies dysuria. She also c/o lower abd discomfort described as cramping.   Leg pain: States she has had bilat leg pain that started 18 days ago. States pain now only located to the right leg. Radiates down the posterior aspect of the leg. Has tried advil and muscle cream without relief. Has h/o sciatica and lower back pain. Pt denies any numbness to the BLE. Denies saddle anesthesia. Denies loss of control of bowels or bladder. No urinary retention. No fevers. Denies a h/o IVDU. Denies a h/o CA. Denies leg swelling, hemoptysis, recent surgery/trauma, recent long travel, hormone use, personal hx of DVT/PE.   History reviewed. No pertinent past medical history.  Patient Active Problem List   Diagnosis Date Noted  . Acute pyelonephritis 02/02/2019    Past Surgical History:  Procedure Laterality Date  . CESAREAN SECTION       OB History   No obstetric history on file.     No family history on file.  Social History   Tobacco Use  . Smoking status: Current Every Day Smoker    Packs/day: 0.25    Types: Cigarettes  . Smokeless tobacco: Never Used  Substance Use Topics  . Alcohol use: No  . Drug use: Not on file    Home Medications Prior to Admission medications   Medication Sig Start Date End Date Taking? Authorizing Provider  HYDROcodone-acetaminophen (NORCO/VICODIN) 5-325 MG  tablet Take 1-2 tablets by mouth every 6 (six) hours as needed for moderate pain or severe pain. Patient not taking: Reported on 11/08/2019 02/04/19   Ihor Austin, MD  ibuprofen (ADVIL) 600 MG tablet Take 1 tablet (600 mg total) by mouth every 6 (six) hours as needed for up to 5 days. 11/08/19 11/13/19  Couture, Cortni S, PA-C  methocarbamol (ROBAXIN) 500 MG tablet Take 1 tablet (500 mg total) by mouth 2 (two) times daily. 11/08/19   Couture, Cortni S, PA-C    Allergies    Patient has no known allergies.  Review of Systems   Review of Systems  Constitutional: Negative for fever.  HENT: Negative for congestion.   Respiratory: Negative for shortness of breath.   Cardiovascular: Negative for chest pain and leg swelling.  Gastrointestinal: Negative for constipation, diarrhea, nausea and vomiting.  Genitourinary: Positive for pelvic pain and vaginal bleeding. Negative for dysuria and vaginal discharge.  Musculoskeletal:       Right leg pain  Skin: Negative for wound.  Neurological: Negative for headaches.    Physical Exam Updated Vital Signs BP 112/86   Pulse 82   Temp 97.8 F (36.6 C) (Oral)   Resp 17   Ht 5\' 3"  (1.6 m)   Wt 79.4 kg   LMP 11/08/2019   SpO2 99%   BMI 31.00 kg/m   Physical Exam Vitals and nursing note reviewed.  Constitutional:  General: She is not in acute distress.    Appearance: She is well-developed.  HENT:     Head: Normocephalic and atraumatic.  Eyes:     Conjunctiva/sclera: Conjunctivae normal.  Cardiovascular:     Rate and Rhythm: Normal rate and regular rhythm.     Heart sounds: No murmur heard.   Pulmonary:     Effort: Pulmonary effort is normal. No respiratory distress.     Breath sounds: Normal breath sounds.  Abdominal:     Palpations: Abdomen is soft.     Tenderness: There is no abdominal tenderness.  Genitourinary:    Comments: Exam performed by Karrie Meres,  exam chaperoned Date: 11/08/2019 Pelvic exam: normal external  genitalia without evidence of trauma. VULVA: normal appearing vulva with no masses, tenderness or lesion. VAGINA: normal appearing vagina with normal color and discharge, no lesions. CERVIX: normal appearing cervix without lesions, cervical os closed with out purulent discharge; vaginal discharge - none, Wet prep and DNA probe for chlamydia and GC obtained.  BIMANUAL: Deferred Musculoskeletal:        General: Tenderness (right posterior leg TTP) present.     Cervical back: Neck supple.     Right lower leg: No edema.     Left lower leg: No edema.     Comments: No midline lumbar spine TTP. 5/5 strength to the BLE. Normal sensation throughout.   Skin:    General: Skin is warm and dry.  Neurological:     Mental Status: She is alert.     ED Results / Procedures / Treatments   Labs (all labs ordered are listed, but only abnormal results are displayed) Labs Reviewed  WET PREP, GENITAL - Abnormal; Notable for the following components:      Result Value   Clue Cells Wet Prep HPF POC PRESENT (*)    All other components within normal limits  URINALYSIS, ROUTINE W REFLEX MICROSCOPIC - Abnormal; Notable for the following components:   APPearance CLOUDY (*)    Hgb urine dipstick LARGE (*)    Protein, ur 30 (*)    Leukocytes,Ua TRACE (*)    Bacteria, UA RARE (*)    All other components within normal limits  URINE CULTURE  I-STAT BETA HCG BLOOD, ED ([facility], [facility], [facility] ONLY)  I-STAT CHEM 8, ED  GC/CHLAMYDIA PROBE AMP ([place]) NOT AT [facility]    EKG None  Radiology VAS [place] LOWER EXTREMITY VENOUS (DVT) (ONLY [facility] & [facility] 7a-7p)  Result Date: 11/08/2019  Lower Venous DVTStudy Indications: Pain.  Comparison Study: No prior studies. Performing Technologist: Jean Rosenthal  Examination Guidelines: A complete evaluation includes B-mode imaging, spectral Doppler, color Doppler, and power Doppler as needed of all accessible portions of each vessel. Bilateral testing is considered an integral part of a complete  examination. Limited examinations for reoccurring indications may be performed as noted. The reflux portion of the exam is performed with the patient in reverse Trendelenburg.  +---------+---------------+---------+-----------+----------+--------------+ RIGHT    CompressibilityPhasicitySpontaneityPropertiesThrombus Aging +---------+---------------+---------+-----------+----------+--------------+ CFV      Full           Yes      Yes                                 +---------+---------------+---------+-----------+----------+--------------+ SFJ      Full                                                        +---------+---------------+---------+-----------+----------+--------------+  FV Prox  Full                                                        +---------+---------------+---------+-----------+----------+--------------+ FV Mid   Full                                                        +---------+---------------+---------+-----------+----------+--------------+ FV DistalFull                                                        +---------+---------------+---------+-----------+----------+--------------+ PFV      Full                                                        +---------+---------------+---------+-----------+----------+--------------+ POP      Full           Yes      Yes                                 +---------+---------------+---------+-----------+----------+--------------+ PTV      Full                                                        +---------+---------------+---------+-----------+----------+--------------+ PERO     Full                                                        +---------+---------------+---------+-----------+----------+--------------+   +----+---------------+---------+-----------+----------+--------------+ LEFTCompressibilityPhasicitySpontaneityPropertiesThrombus Aging  +----+---------------+---------+-----------+----------+--------------+ CFV Full           Yes      Yes                                 +----+---------------+---------+-----------+----------+--------------+     Summary: RIGHT: - There is no evidence of deep vein thrombosis in the lower extremity.  - No cystic structure found in the popliteal fossa.  LEFT: - No evidence of common femoral vein obstruction.  *See table(s) above for measurements and observations.    Preliminary     Procedures Procedures (including critical care time)  Medications Ordered in ED Medications  ketorolac (TORADOL) 30 MG/ML injection 30 mg (30 mg Intramuscular Given 11/08/19 0627)  acetaminophen (TYLENOL) tablet 1,000 mg (1,000 mg Oral Given 11/08/19 [id number])    ED Course  I have reviewed the triage vital signs and the nursing notes.  Pertinent labs & imaging results that  were available during my care of the patient were reviewed by me and considered in my medical decision making (see chart for details).  Clinical Course as of Nov 08 1110  Wed Nov 08, 2019  0905 Lower externally Doppler ultrasound reviewed myself.  There is no evidence of DVT.  Patient given reassurance.  Discharged with naproxen and muscle relaxer.  Summary: RIGHT: - There is no evidence of deep vein thrombosis in the lower extremity.  - No cystic structure found in the popliteal fossa.  LEFT: - No evidence of common femoral vein obstruction.   [WF]    Clinical Course User Index [WF] Gailen Shelter, PA   MDM Rules/Calculators/A&P                          39 year old female presenting for evaluation of vaginal bleeding and leg pain.  History of sciatica.   Normal neuro exam. Pelvic exam with no vaginal bleeding or discharge.  Chem 8 with normal hgb Beta hcg neg Wet prep with clue cells, no WBCs Gc/chlam pending UA with trace leuks, 0-5 RBC, 6-10 WBC, rare bacteria, 6-10 squams. Likely contaminated. Culture sent.   Pt given  toradol for pain.   At shift change, pt pending RLE [place]. Care transitioned to [facility], PA-C with plan to f/u on [place]. If neg, d/c with naproxen and muscle relaxer.   Final Clinical Impression(s) / ED Diagnoses Final diagnoses:  Right leg pain  Vaginal bleeding    Rx / DC Orders ED Discharge Orders         Ordered    ibuprofen (ADVIL) 600 MG tablet  Every 6 hours PRN     Discontinue  Reprint     11/08/19 0644    methocarbamol (ROBAXIN) 500 MG tablet  2 times daily     Discontinue  Reprint     11/08/19 0644           Karrie Meres, PA-C 11/08/19 7412    Palumbo, April, MD 11/08/19 0704    Gailen Shelter, PA 11/08/19 1112    Jacalyn Lefevre, MD 11/25/19 1458

## 2019-11-08 NOTE — Discharge Instructions (Addendum)
You may alternate taking Tylenol and Ibuprofen as needed for pain control. You may take 400-600 mg of ibuprofen every 6 hours and [id number] mg of Tylenol every 6 hours. Do not exceed 4000 mg of Tylenol daily as this can lead to liver damage. Also, make sure to take Ibuprofen with meals as it can cause an upset stomach. Do not take other NSAIDs while taking Ibuprofen such as (Aleve, Naprosyn, Aspirin, Celebrex, etc) and do not take more than the prescribed dose as this can lead to ulcers and bleeding in your GI tract. You may use warm and cold compresses to help with your symptoms.   You were given a prescription for Robaxin which is a muscle relaxer.  You should not drive, work, or operate machinery while taking this medication as it can make you very drowsy.     Please follow up with your primary doctor and with the OB-GYN within the next 7-10 days for re-evaluation and further treatment of your symptoms.   Please return to the ER sooner if you have any new or worsening symptoms.

## 2019-11-09 LAB — GC/CHLAMYDIA PROBE AMP (~~LOC~~) NOT AT ARMC
Chlamydia: NEGATIVE
Comment: NEGATIVE
Comment: NORMAL
Neisseria Gonorrhea: NEGATIVE

## 2019-11-09 LAB — URINE CULTURE: Culture: >=100000 — AB

## 2020-02-04 ENCOUNTER — Encounter: Payer: Self-pay | Admitting: Emergency Medicine

## 2020-02-04 ENCOUNTER — Emergency Department
Admission: EM | Admit: 2020-02-04 | Discharge: 2020-02-04 | Disposition: A | Discharge status: Home / Self Care | Payer: Self-pay | Attending: Emergency Medicine | Admitting: Emergency Medicine

## 2020-02-04 ENCOUNTER — Other Ambulatory Visit: Payer: Self-pay

## 2020-02-04 ENCOUNTER — Emergency Department: Payer: Self-pay

## 2020-02-04 DIAGNOSIS — I469 Cardiac arrest, cause unspecified: Secondary | ICD-10-CM | POA: Insufficient documentation

## 2020-02-04 DIAGNOSIS — T401X1A Poisoning by heroin, accidental (unintentional), initial encounter: Secondary | ICD-10-CM | POA: Insufficient documentation

## 2020-02-04 DIAGNOSIS — R092 Respiratory arrest: Secondary | ICD-10-CM

## 2020-02-04 DIAGNOSIS — F17210 Nicotine dependence, cigarettes, uncomplicated: Secondary | ICD-10-CM | POA: Insufficient documentation

## 2020-02-04 LAB — CBC WITH DIFFERENTIAL/PLATELET
Abs Immature Granulocytes: 0.03 10*3/uL (ref 0.00–0.07)
Basophils Absolute: 0.1 10*3/uL (ref 0.0–0.1)
Basophils Relative: 1 %
Eosinophils Absolute: 0.9 10*3/uL — ABNORMAL HIGH (ref 0.0–0.5)
Eosinophils Relative: 7 %
HCT: 32.2 % — ABNORMAL LOW (ref 36.0–46.0)
Hemoglobin: 10.3 g/dL — ABNORMAL LOW (ref 12.0–15.0)
Immature Granulocytes: 0 %
Lymphocytes Relative: 54 %
Lymphs Abs: 6.6 10*3/uL — ABNORMAL HIGH (ref 0.7–4.0)
MCH: 26.3 pg (ref 26.0–34.0)
MCHC: 32 g/dL (ref 30.0–36.0)
MCV: 82.1 fL (ref 80.0–100.0)
Monocytes Absolute: 1 10*3/uL (ref 0.1–1.0)
Monocytes Relative: 8 %
Neutro Abs: 3.7 10*3/uL (ref 1.7–7.7)
Neutrophils Relative %: 30 %
Platelets: 398 10*3/uL (ref 150–400)
RBC: 3.92 MIL/uL (ref 3.87–5.11)
RDW: 15.9 % — ABNORMAL HIGH (ref 11.5–15.5)
WBC: 12.4 10*3/uL — ABNORMAL HIGH (ref 4.0–10.5)
nRBC: 0 % (ref 0.0–0.2)

## 2020-02-04 LAB — COMPREHENSIVE METABOLIC PANEL
ALT: 12 U/L (ref 0–44)
AST: 28 U/L (ref 15–41)
Albumin: 3.8 g/dL (ref 3.5–5.0)
Alkaline Phosphatase: 70 U/L (ref 38–126)
Anion gap: 9 (ref 5–15)
BUN: 17 mg/dL (ref 6–20)
CO2: 28 mmol/L (ref 22–32)
Calcium: 9 mg/dL (ref 8.9–10.3)
Chloride: 104 mmol/L (ref 98–111)
Creatinine, Ser: 1 mg/dL (ref 0.44–1.00)
GFR calc Af Amer: >60 mL/min (ref >60)
GFR calc non Af Amer: >60 mL/min (ref >60)
Glucose, Bld: 160 mg/dL — ABNORMAL HIGH (ref 70–99)
Potassium: 3.7 mmol/L (ref 3.5–5.1)
Sodium: 141 mmol/L (ref 135–145)
Total Bilirubin: 0.4 mg/dL (ref 0.3–1.2)
Total Protein: 7.4 g/dL (ref 6.5–8.1)

## 2020-02-04 LAB — HCG, QUANTITATIVE, PREGNANCY: hCG, Beta Chain, Quant, S: <1 m[IU]/mL (ref <5)

## 2020-02-04 MED ORDER — LACTATED RINGERS IV BOLUS
1000.0000 mL | Freq: Once | INTRAVENOUS | Status: AC
  Administered 2020-02-04: 1000 mL via INTRAVENOUS

## 2020-02-04 MED ORDER — ONDANSETRON HCL 4 MG/2ML IJ SOLN
4.0000 mg | Freq: Once | INTRAMUSCULAR | Status: AC
  Administered 2020-02-04: 4 mg via INTRAVENOUS

## 2020-02-04 MED ORDER — LORAZEPAM 2 MG/ML IJ SOLN
2.0000 mg | Freq: Once | INTRAMUSCULAR | Status: AC
  Administered 2020-02-04: 2 mg via INTRAVENOUS

## 2020-02-04 MED ORDER — NALOXONE HCL 2 MG/2ML IJ SOSY
2.0000 mg | PREFILLED_SYRINGE | Freq: Once | INTRAMUSCULAR | Status: AC
  Administered 2020-02-04: 2 mg via INTRAVENOUS

## 2020-02-04 MED ORDER — NALOXONE HCL 2 MG/2ML IJ SOSY
2.0000 mg | PREFILLED_SYRINGE | Freq: Once | INTRAMUSCULAR | Status: AC
  Administered 2020-02-04: 2 mg via NASAL

## 2020-02-04 MED FILL — Medication: Qty: 1 | Status: AC

## 2020-02-04 NOTE — ED Provider Notes (Signed)
[facility] Emergency Department Provider Note  ____________________________________________  Time seen: Approximately 5:23 AM  I have reviewed the triage vital signs and the nursing notes.   HISTORY  Chief Complaint Drug Overdose  Level 5 caveat:  Portions of the history and physical were unable to be obtained due to cardiac arrest   HPI Mary Klein is a 39 y.o. female who presents for respiratory arrest.  Patient was taken out of her vehicle after an overdose on heroin.  Patient was cyanotic and pulseless upon arrival.     History reviewed. No pertinent past medical history.  Patient Active Problem List   Diagnosis Date Noted   Acute pyelonephritis 02/02/2019    Past Surgical History:  Procedure Laterality Date   CESAREAN SECTION      Prior to Admission medications   Medication Sig Start Date End Date Taking? Authorizing Provider  HYDROcodone-acetaminophen (NORCO/VICODIN) 5-325 MG tablet Take 1-2 tablets by mouth every 6 (six) hours as needed for moderate pain or severe pain. Patient not taking: Reported on 11/08/2019 02/04/19   Ihor Austin, MD  methocarbamol (ROBAXIN) 500 MG tablet Take 1 tablet (500 mg total) by mouth 2 (two) times daily. 11/08/19   Couture, Cortni S, PA-C    Allergies Patient has no known allergies.  No family history on file.  Social History Social History   Tobacco Use   Smoking status: Current Every Day Smoker    Packs/day: 0.25    Types: Cigarettes   Smokeless tobacco: Never Used  Substance Use Topics   Alcohol use: No   Drug use: Yes    Comment: herion     Review of Systems  Cardiovascular: + no pulse Respiratory: + not breathing  ____________________________________________   PHYSICAL EXAM:  VITAL SIGNS: Vitals:   02/04/20 0517 02/04/20 0530  BP: 107/74 129/75  Pulse: (!) 127 94  Resp: (!) 22 11  Temp: 97.9 F (36.6 C)   SpO2:  100%    Constitutional: GCS 3, cyanotic,  pulseless HEENT:      Head: Normocephalic and atraumatic.         Eyes: Pinpoint pupils      Mouth/Throat: Mucous membranes are moist.       Neck: Supple with no signs of meningismus. Cardiovascular: No pulse Respiratory: respiratory arrest, cyanotic Gastrointestinal: Soft, non distended. Musculoskeletal:  No edema, cyanosis, or erythema of extremities. Neurologic: GCS 3 Skin: Skin is warm, dry and intact. No rash noted.  ____________________________________________   LABS (all labs ordered are listed, but only abnormal results are displayed)  Labs Reviewed  CBC WITH DIFFERENTIAL/PLATELET - Abnormal; Notable for the following components:      Result Value   WBC 12.4 (*)    Hemoglobin 10.3 (*)    HCT 32.2 (*)    RDW 15.9 (*)    Lymphs Abs 6.6 (*)    Eosinophils Absolute 0.9 (*)    All other components within normal limits  COMPREHENSIVE METABOLIC PANEL - Abnormal; Notable for the following components:   Glucose, Bld 160 (*)    All other components within normal limits  HCG, QUANTITATIVE, PREGNANCY  URINE DRUG SCREEN, QUALITATIVE ([facility] ONLY)   ____________________________________________  EKG  ED ECG REPORT I, Nita Sickle, the attending physician, personally viewed and interpreted this ECG.  Normal sinus rhythm, rate of 93, normal intervals, normal axis, no ST elevations or depressions. ____________________________________________  RADIOLOGY  I have personally reviewed the images performed during this visit and I agree with the  Radiologist's read.   Interpretation by Radiologist:  DG Chest Portable 1 View  Result Date: 02/04/2020 CLINICAL DATA:  Cardiac arrest status post CPR. EXAM: PORTABLE CHEST 1 VIEW COMPARISON:  None. FINDINGS: Telemetry leads and fibrillator pads overlie the chest. The cardiomediastinal silhouette is within normal limits. No confluent airspace opacity, edema, sizable pleural effusion, or pneumothorax is identified. No acute osseous  abnormality is seen. IMPRESSION: No active disease. Electronically Signed   By: Sebastian Ache M.D.   On: 02/04/2020 05:46      ____________________________________________   PROCEDURES  Procedure(s) performed:yes Procedures Critical Care performed: yes  CRITICAL CARE Performed by: Nita Sickle  ?  Total critical care time: 40 min  Critical care time was exclusive of separately billable procedures and treating other patients.  Critical care was necessary to treat or prevent imminent or life-threatening deterioration.  Critical care was time spent personally by me on the following activities: development of treatment plan with patient and/or surrogate as well as nursing, discussions with consultants, evaluation of patient's response to treatment, examination of patient, obtaining history from patient or surrogate, ordering and performing treatments and interventions, ordering and review of laboratory studies, ordering and review of radiographic studies, pulse oximetry and re-evaluation of patient's condition.  ____________________________________________   INITIAL IMPRESSION / ASSESSMENT AND PLAN / ED COURSE   39 y.o. female who presents for respiratory arrest.  Patient taken out of a vehicle for possible heroin overdose, cyanotic, pulseless with cardiac and respiratory arrest.  CPR was started immediately after patient was pulled out of her vehicle. Upon arrival to the room, patient started to be bagged with ROSC. RR 3. Given narcan IN while access was obtained with minimal improvement. After IV access, patient was given 4mg  of zofran followed by 2mg  of IV narcan. Patient immediately started to breath and regained consciousness. Became agitated, combative requiring 2mg  of IV ativan for patient and staff's safety. Patient calmed down. Patient vomited once. Patient admits to heroin tonight. Will get CXR to rule out PTX from CPR. Will get basic labs. Will monitor patient closely for  need to redose narcan. Patient denies suicidal intent. Old medical records reviewed. Patient placed on end-tidal and telemetry for close monitoring.  _________________________ 5:52 AM on 02/04/2020 -----------------------------------------  CXR negative for PTX or signs of aspiration. RR normal, end-tidal normal, patient very somnolent. Will continue to monitor.   _________________________ 6:51 AM on 02/04/2020 -----------------------------------------  Respiratory status stable, no need to redose narcan. Patient sleeping but arousable to sternal rub. Will continue to monitor. Care transferred to Dr. at [facility] _____________________________________________ Please note:  Patient was evaluated in Emergency Department today for the symptoms described in the history of present illness. Patient was evaluated in the context of the global COVID-19 pandemic, which necessitated consideration that the patient might be at risk for infection with the SARS-CoV-2 virus that causes COVID-19. Institutional protocols and algorithms that pertain to the evaluation of patients at risk for COVID-19 are in a state of rapid change based on information released by regulatory bodies including the CDC and federal and state organizations. These policies and algorithms were followed during the patient's care in the ED.  Some ED evaluations and interventions may be delayed as a result of limited staffing during the pandemic.   [place] Controlled Substance Database was reviewed by me. ____________________________________________   FINAL CLINICAL IMPRESSION(S) / ED DIAGNOSES   Final diagnoses:  Accidental overdose of heroin, initial encounter [facility])  Respiratory arrest (HCC)  Cardiac arrest (HCC)  NEW MEDICATIONS STARTED DURING THIS VISIT:  ED Discharge Orders    None       Note:  This document was prepared using Dragon voice recognition software and may include unintentional dictation errors.      Don Perking, [place], MD 02/04/20 [phone]

## 2020-02-04 NOTE — ED Triage Notes (Signed)
Patient brought in by POV for possible overdose. Patient pulled from the car. Patient with no pulse. CPR started at 05:05. Patient bagged by Dr. Don Perking starting at 05:06. Patient was given 2 mg of intranasal Narcan a5 05:06. Patient SR 67 with positive pulse at 05:07. Patient given 4 mg of Zofran and 2 mg Narcan IV at 05:11. Patient became alert and combative at 05:12. Patient given 2 mg ativan IV at 05:15.

## 2020-02-04 NOTE — ED Provider Notes (Signed)
Patient received in signout from Dr. Don Perking after opiate overdose causing respiratory arrest.  Patient provided Narcan and subsequent agitated, requiring benzodiazepine administration.  Patient sleeping comfortably, stable, at the time of signout.  7:30 AM reassessed.  Patient awakens to vocal stimulation and denies pain.  Falls back asleep.  8:20 AM reassessed.  Patient more awake, sitting up in the side of the bed and requesting to leave.  Educated the patient that we will require a few minutes to ensure a p.o. challenge and ambulation trial without complications.  It has been about 3 hours since Narcan administration and there has been no indications for repeat administration. Provided patient with multiple printouts for substance abuse treatments and detox.  She is denying any suicidality or intentional overdose.  Boyfriend is at the bedside and indicates that he will ensure she follows up with substance abuse treatment.  We discussed return precautions for the ED.  8:45 AM patient has tolerated p.o. intake without nausea, vomiting or abdominal pain.  Continues to deny suicidality.  Boyfriend at the bedside ready to take her home.  Reaffirmed return precautions for the ED.  Medically stable for discharge home.   Delton [place], MD 02/04/20 [phone]

## 2020-06-17 ENCOUNTER — Ambulatory Visit: Payer: Self-pay | Attending: Internal Medicine

## 2020-06-17 DIAGNOSIS — Z23 Encounter for immunization: Secondary | ICD-10-CM

## 2020-06-17 NOTE — Progress Notes (Signed)
   Covid-19 Vaccination Clinic  Name:  Mary Klein    MRN: [id number] DOB: 01/24/1981  06/17/2020  Mary Klein was observed post Covid-19 immunization for 15 minutes without incident. She was provided with Vaccine Information Sheet and instruction to access the V-Safe system.   Mary Klein was instructed to call 911 with any severe reactions post vaccine: Marland Kitchen Difficulty breathing  . Swelling of face and throat  . A fast heartbeat  . A bad rash all over body  . Dizziness and weakness   Immunizations Administered    Name Date Dose VIS Date Route   PFIZER Comrnaty(Gray TOP) Covid-19 Vaccine 06/17/2020  1:12 PM 0.3 mL 04/11/2020 Intramuscular   Manufacturer: [employer], [employer]   Lot: [id number]   NDC: [phone]

## 2020-07-09 ENCOUNTER — Ambulatory Visit: Payer: Self-pay

## 2021-09-24 IMAGING — CT CT RENAL STONE PROTOCOL
2 of 4 series · 15 of 46 positions shown, 17 images · non-contrast
Comparison: None.

CLINICAL DATA: Flank pain and dysuria

EXAM:
CT ABDOMEN AND PELVIS WITHOUT CONTRAST
TECHNIQUE: Multidetector CT imaging of the abdomen and pelvis was performed
following the standard protocol without oral or IV contrast.

[Series 2: stone full standard · axial · 0.69mm/px · z∈[+138,+523]mm · 12 of 89 slices shown, 14 images]
[im 8/89  soft-tissue]
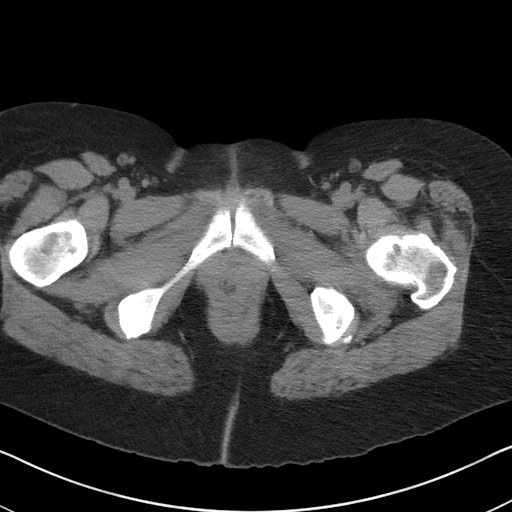
[im 8/89  bone]
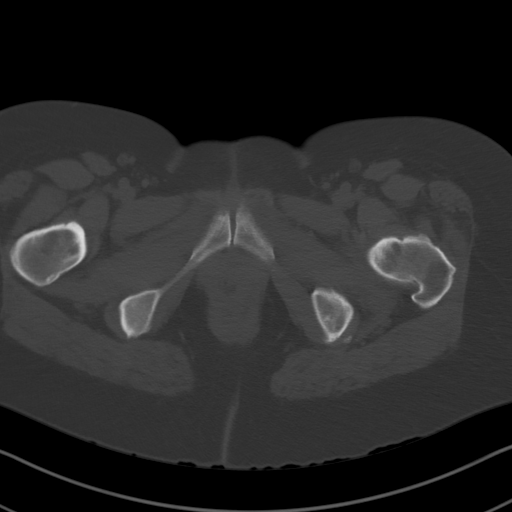
[im 15/89  soft-tissue]
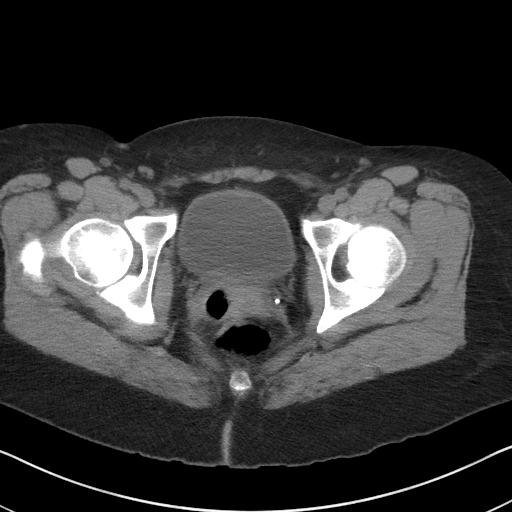
[im 22/89  soft-tissue]
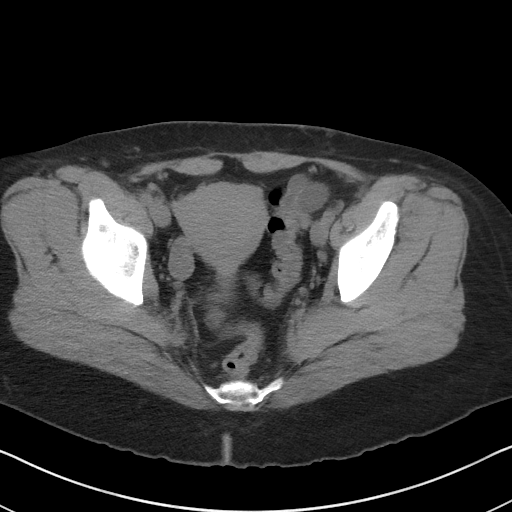
[im 29/89  soft-tissue]
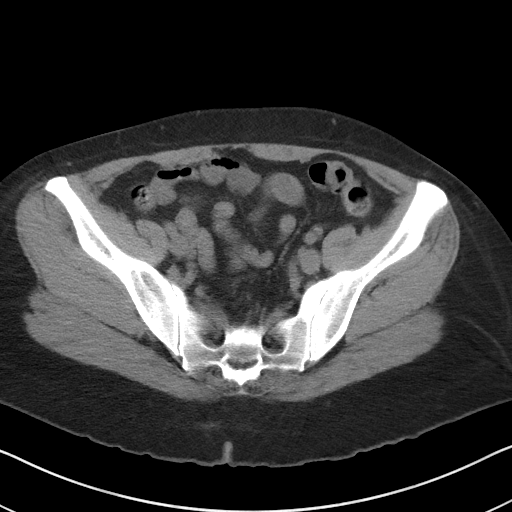
[im 36/89  soft-tissue]
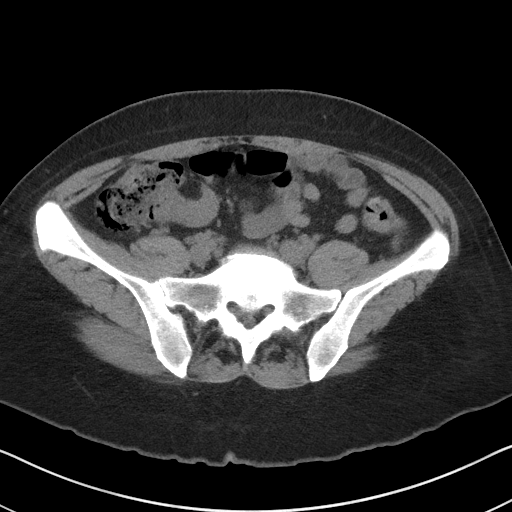
[im 43/89  soft-tissue]
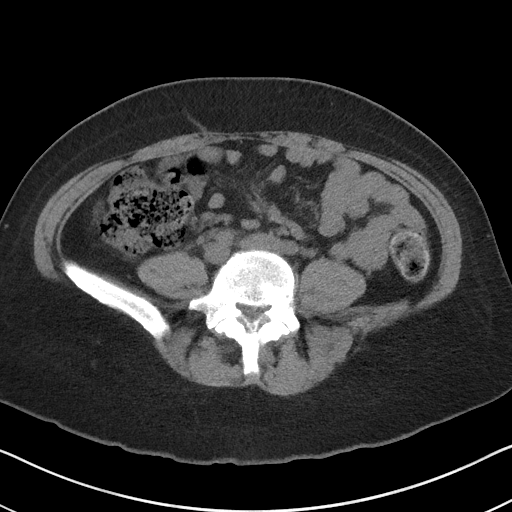
[im 50/89  soft-tissue]
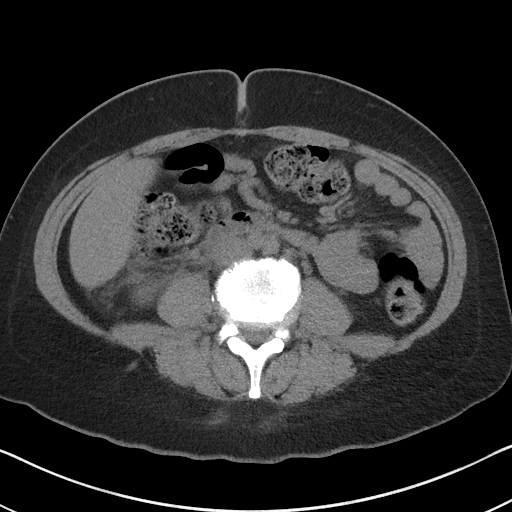
[im 57/89  soft-tissue]
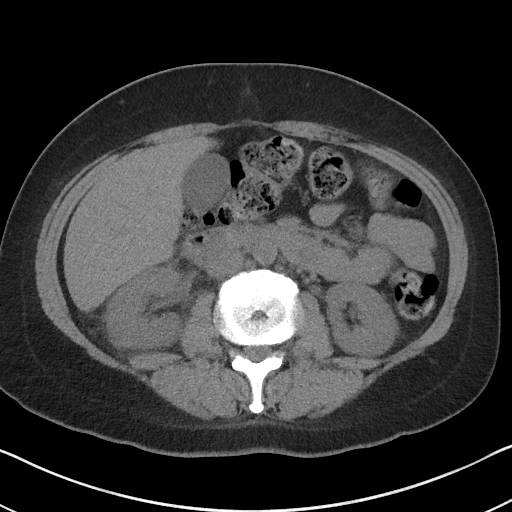
[im 64/89  soft-tissue]
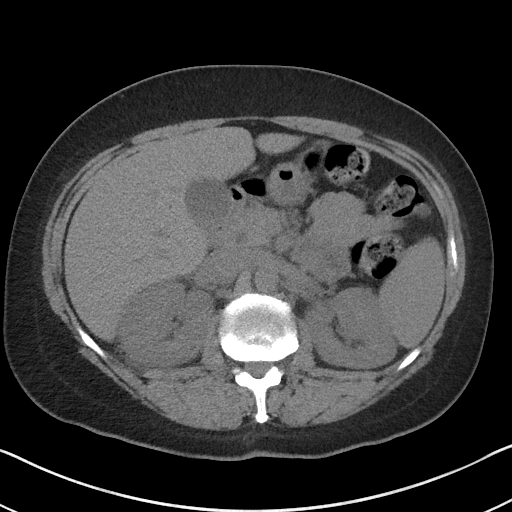
[im 64/89  bone]
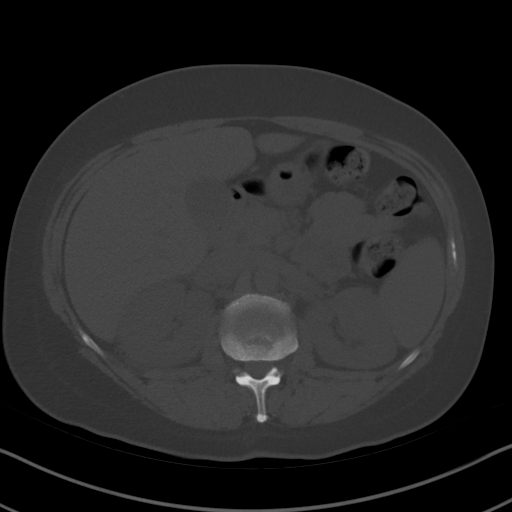
[im 71/89  soft-tissue]
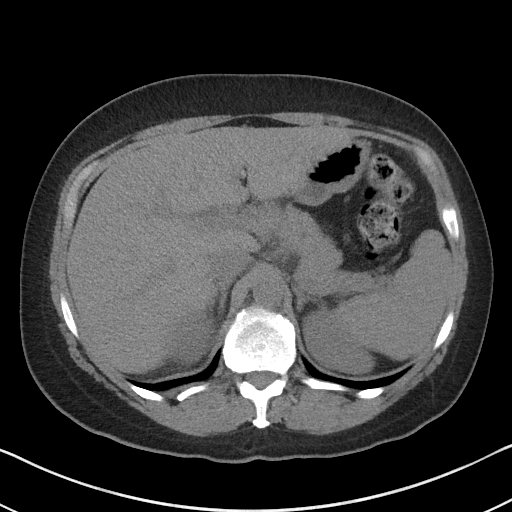
[im 78/89  soft-tissue]
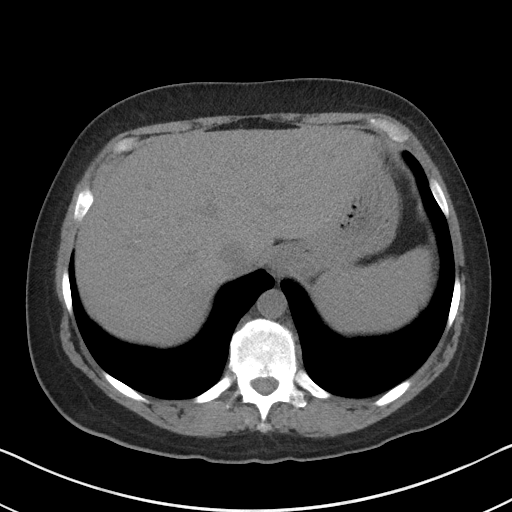
[im 85/89  soft-tissue]
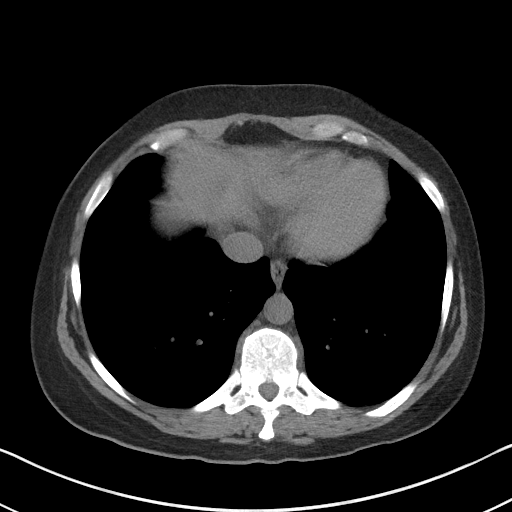

[Series 5: coronal · coronal · 0.69mm/px · 3 of 132 slices shown]
[im 44/132  soft-tissue]
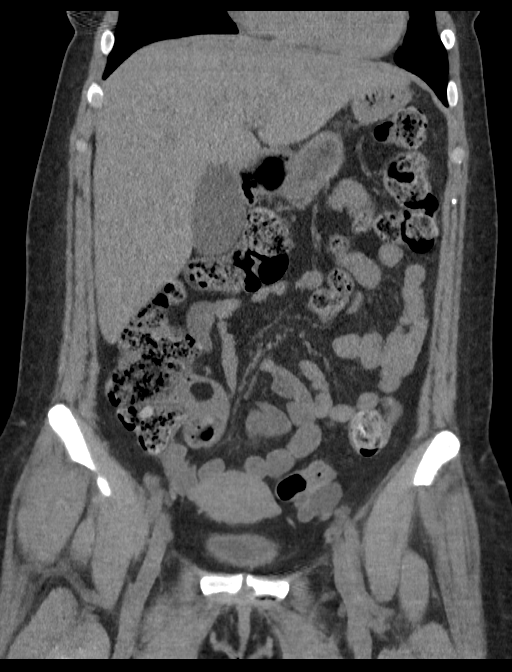
[im 59/132  soft-tissue]
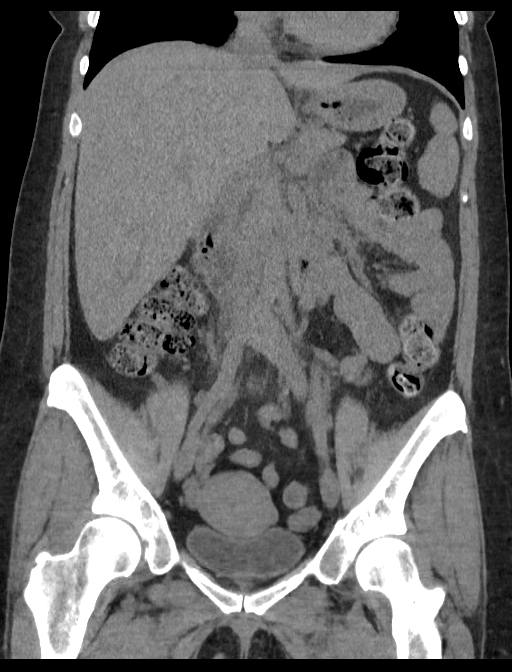
[im 73/132  soft-tissue]
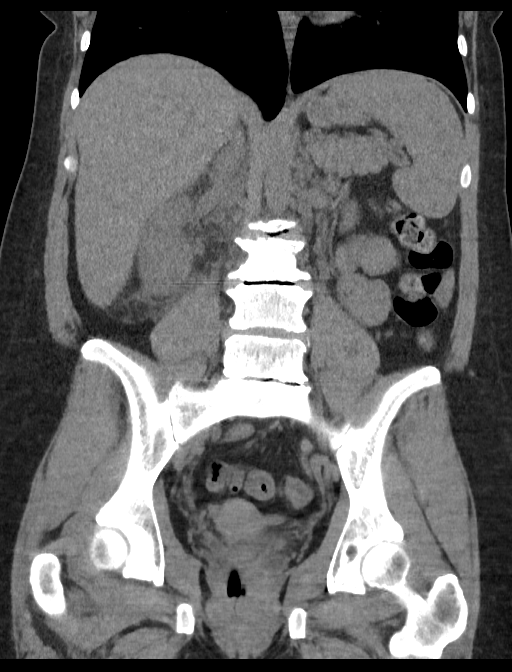

[15 of 46 positions shown; findings below may reference images not displayed]

FINDINGS: Lower chest: Lung bases are clear.

Hepatobiliary: Liver measures 21.4 cm in length. No focal liver
lesions are evident on this noncontrast enhanced study. Gallbladder
wall is not appreciably thickened. There is no biliary duct
dilatation.

Pancreas: No pancreatic mass or inflammatory focus.

Spleen: No splenic lesions are evident.

Adrenals/Urinary Tract: Adrenals bilaterally appear unremarkable.
The right kidney is edematous with perinephric stranding. There is
no evident renal mass on either side. There is mild hydronephrosis
on the right. There is no appreciable hydronephrosis on the left.
There is a 1 mm calculus in the mid right kidney. There is no
intrarenal calculus on the left. There is no appreciable ureteral
calculus on either side. Urinary bladder is midline with wall
thickness within normal limits. Note that there are phleboliths near
but felt to be separate from each distal ureter.

Stomach/Bowel: There is no appreciable bowel wall or mesenteric
thickening. There is no evident bowel obstruction. There is a 1.8 x
1.6 cm lipoma at the terminal ileum-cecum junction. This lipoma
appears to be slightly proximal to the expected level of the
ileocecal valve. No free air or portal venous air evident.

Vascular/Lymphatic: There is no abdominal aortic aneurysm. No
vascular lesions are evident on this noncontrast enhanced study.
There is no appreciable adenopathy in the abdomen or pelvis.

Reproductive: Uterus is anteverted. There are follicles in each
ovary. Beyond these follicles, there is no extrauterine pelvic mass.
Tampon present.

Other: Appendix appears unremarkable. There is no abscess or ascites
in abdomen or pelvis.

Musculoskeletal: There is multilevel degenerative change in the
lumbar spine. There is a nitrogen containing cystic area in the L3
vertebral body measuring 1.3 x 1.0 cm. No blastic or lytic bone
lesions. No intramuscular or abdominal wall lesions are evident.
IMPRESSION: 1. The right kidney is edematous with perinephric stranding. There
is mild hydronephrosis and ureteral prominence on the right. No
obstructing focus is evident. Question recent calculus passage on
the right. Pyelonephritis is a differential consideration given this
appearance. Note that urinary bladder wall thickness is within
normal limits.

2. Benign lipoma in the distal most aspect of the ileum near the
junction with the cecum. No bowel obstruction. No abscess in the
abdomen or pelvis. Appendix appears normal.

3.  Prominent liver without focal liver lesion evident.

4.  Extensive arthropathy throughout the lumbar spine.

## 2022-06-30 ENCOUNTER — Encounter: Payer: Self-pay | Admitting: Emergency Medicine

## 2022-06-30 ENCOUNTER — Other Ambulatory Visit: Payer: Self-pay

## 2022-06-30 ENCOUNTER — Emergency Department
Admission: EM | Admit: 2022-06-30 | Discharge: 2022-06-30 | Disposition: A | Discharge status: Home / Self Care | Payer: 59 | Attending: Emergency Medicine | Admitting: Emergency Medicine

## 2022-06-30 DIAGNOSIS — Z1152 Encounter for screening for COVID-19: Secondary | ICD-10-CM | POA: Insufficient documentation

## 2022-06-30 DIAGNOSIS — R059 Cough, unspecified: Secondary | ICD-10-CM | POA: Diagnosis not present

## 2022-06-30 DIAGNOSIS — J069 Acute upper respiratory infection, unspecified: Secondary | ICD-10-CM | POA: Insufficient documentation

## 2022-06-30 DIAGNOSIS — B9789 Other viral agents as the cause of diseases classified elsewhere: Secondary | ICD-10-CM | POA: Diagnosis not present

## 2022-06-30 LAB — RESP PANEL BY RT-PCR (RSV, FLU A&B, COVID)  RVPGX2
Influenza A by PCR: NEGATIVE
Influenza B by PCR: NEGATIVE
Resp Syncytial Virus by PCR: NEGATIVE
SARS Coronavirus 2 by RT PCR: NEGATIVE

## 2022-06-30 LAB — GROUP A STREP BY PCR: Group A Strep by PCR: NOT DETECTED

## 2022-06-30 MED ORDER — PSEUDOEPH-BROMPHEN-DM 30-2-10 MG/5ML PO SYRP: 5.0000 mL | ORAL_SOLUTION | Freq: Four times a day (QID) | ORAL | 0 refills | Status: AC | PRN

## 2022-06-30 NOTE — ED Provider Notes (Signed)
[facility] Provider Note    Event Date/Time   First MD Initiated Contact with Patient 06/30/22 1122     (approximate)   History   Generalized Body Aches and Cough   HPI  Mary Klein is a 42 y.o. female   presents to the ED with complaint of fever of 101, sore throat and cough for the last 3 days.  Patient reports that it came on slowly and she continues to have nasal congestion.  She denies any known sick contacts.      Physical Exam   Triage Vital Signs: ED Triage Vitals [06/30/22 1112]  Enc Vitals Group     BP 125/80     Pulse Rate (!) 110     Resp 18     Temp 97.9 F (36.6 C)     Temp Source Oral     SpO2 97 %     Weight      Height      Head Circumference      Peak Flow      Pain Score 10     Pain Loc      Pain Edu?      Excl. in [place]?     Most recent vital signs: Vitals:   06/30/22 1112 06/30/22 1320  BP: 125/80 127/88  Pulse: (!) 110 86  Resp: 18 16  Temp: 97.9 F (36.6 C)   SpO2: 97% 98%     General: Awake, no distress.  CV:  Good peripheral perfusion.  Resp:  Normal effort.  Clear bilaterally. Abd:  No distention.  Other:  Nasal congestion, posterior pharynx injected no exudate was seen.  Uvula is minimally edematous but still midline.  Neck is supple.   ED Results / Procedures / Treatments   Labs (all labs ordered are listed, but only abnormal results are displayed) Labs Reviewed  RESP PANEL BY RT-PCR (RSV, FLU A&B, COVID)  [id number]  GROUP A STREP BY PCR      PROCEDURES:  Critical Care performed:   Procedures   MEDICATIONS ORDERED IN ED: Medications - No data to display   IMPRESSION / MDM / [place] / ED COURSE  I reviewed the triage vital signs and the nursing notes.   Differential diagnosis includes, but is not limited to, COVID, influenza, RSV, strep pharyngitis, viral upper respiratory infection.  42 year old female presents to the ED with complaint of upper respiratory  symptoms for the last 3 days.  Patient was reassured with respiratory panel being negative for COVID, influenza and RSV.  Strep test was negative.  She will continue to treat symptoms symptomatically and a prescription for Bromfed-DM was sent to the pharmacy.  Tylenol or ibuprofen if needed for fever or bodyaches.  Patient is to follow-up with her PCP if any continued problems.      Patient's presentation is most consistent with acute complicated illness / injury requiring diagnostic workup.  FINAL CLINICAL IMPRESSION(S) / ED DIAGNOSES   Final diagnoses:  Viral URI with cough     Rx / DC Orders   ED Discharge Orders          Ordered    brompheniramine-pseudoephedrine-DM 30-2-10 MG/5ML syrup  4 times daily PRN        06/30/22 1317             Note:  This document was prepared using Dragon voice recognition software and may include unintentional dictation errors.   Johnn Hai, PA-C 06/30/22  [place]    Lavonia Drafts, MD 06/30/22 1450

## 2022-06-30 NOTE — Discharge Instructions (Signed)
Follow-up with your primary care provider or [facility] if any continued problems or concerns.  A prescription for Bromfed-DM was sent to the pharmacy for cough and congestion.  Drink lots of fluids to stay hydrated.  Tylenol or ibuprofen as needed for body aches, headache or fever.

## 2022-06-30 NOTE — ED Triage Notes (Signed)
Pt reports fever, sore throat and cough for 3 days.

## 2022-09-26 IMAGING — DX DG CHEST 1V PORT
1 series · 1 of 1 positions shown · non-contrast
Comparison: None.

CLINICAL DATA: Cardiac arrest status post CPR.

EXAM:
PORTABLE CHEST 1 VIEW

[chest ap]
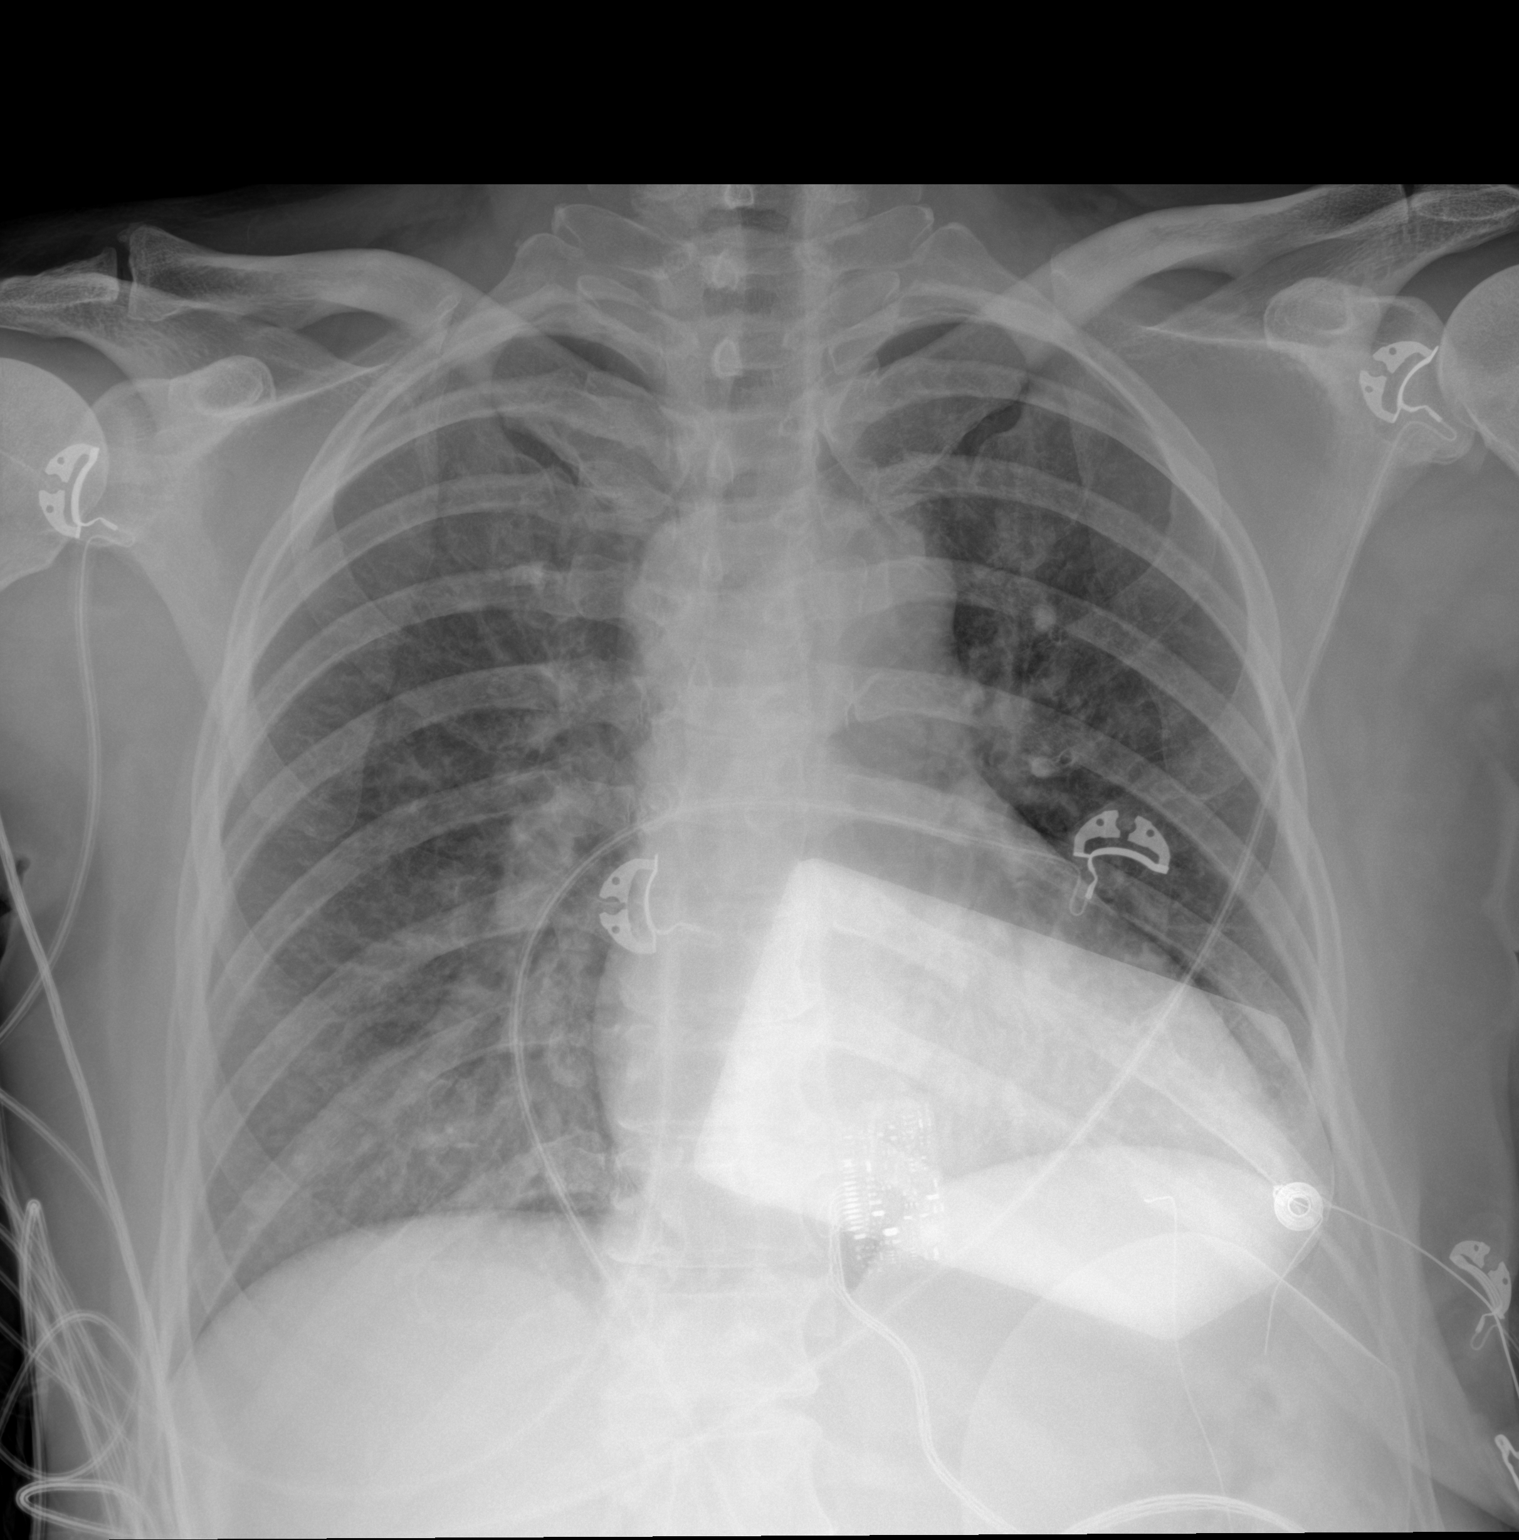

[1 of 1 positions shown; findings below may reference images not displayed]

FINDINGS: Telemetry leads and fibrillator pads overlie the chest. The
cardiomediastinal silhouette is within normal limits. No confluent
airspace opacity, edema, sizable pleural effusion, or pneumothorax
is identified. No acute osseous abnormality is seen.
IMPRESSION: No active disease.
